# Patient Record
Sex: Female | Born: 1975 | Race: Asian | Hispanic: No | Smoking: Never smoker
Health system: Southern US, Community
[De-identification: ages and names within clinical notes are randomized; demographics above are authoritative.]

## PROBLEM LIST (undated history)

## (undated) DIAGNOSIS — K5900 Constipation, unspecified: Secondary | ICD-10-CM

## (undated) DIAGNOSIS — E785 Hyperlipidemia, unspecified: Secondary | ICD-10-CM

## (undated) HISTORY — DX: Hyperlipidemia, unspecified: E78.5

## (undated) HISTORY — DX: Constipation, unspecified: K59.00

---

## 2020-04-20 ENCOUNTER — Ambulatory Visit: Payer: Self-pay | Admitting: Medical

## 2020-04-22 ENCOUNTER — Encounter: Payer: Self-pay | Admitting: Medical

## 2020-04-22 ENCOUNTER — Telehealth: Payer: Self-pay | Admitting: Medical

## 2020-04-22 ENCOUNTER — Other Ambulatory Visit: Payer: Self-pay

## 2020-04-22 ENCOUNTER — Ambulatory Visit (INDEPENDENT_AMBULATORY_CARE_PROVIDER_SITE_OTHER): Payer: PRIVATE HEALTH INSURANCE | Admitting: Medical

## 2020-04-22 VITALS — BP 128/80 | HR 70 | Resp 20 | Ht 64.0 in | Wt 112.2 lb

## 2020-04-22 DIAGNOSIS — M7711 Lateral epicondylitis, right elbow: Secondary | ICD-10-CM | POA: Diagnosis not present

## 2020-04-22 DIAGNOSIS — Z1211 Encounter for screening for malignant neoplasm of colon: Secondary | ICD-10-CM | POA: Diagnosis not present

## 2020-04-22 DIAGNOSIS — E785 Hyperlipidemia, unspecified: Secondary | ICD-10-CM

## 2020-04-22 DIAGNOSIS — S46811A Strain of other muscles, fascia and tendons at shoulder and upper arm level, right arm, initial encounter: Secondary | ICD-10-CM | POA: Diagnosis not present

## 2020-04-22 LAB — LIPID PANEL
Cholesterol: 215 mg/dL — ABNORMAL HIGH (ref 0–200)
HDL: 66.3 mg/dL (ref >39.00)
LDL Cholesterol: 128 mg/dL — ABNORMAL HIGH (ref 0–99)
NonHDL: 148.71
Total CHOL/HDL Ratio: 3
Triglycerides: 104 mg/dL (ref 0.0–149.0)
VLDL: 20.8 mg/dL (ref 0.0–40.0)

## 2020-04-22 MED ORDER — CYCLOBENZAPRINE HCL 5 MG PO TABS
5.0000 mg | ORAL_TABLET | Freq: Every day | ORAL | 0 refills | Status: DC
Start: 2020-04-22 — End: 2020-07-27

## 2020-04-22 NOTE — Patient Instructions (Addendum)
For high cholesterol will get lipid panel today. Recommend diet and exercise. Can use fish oil or krill oil otc.  For elbow pain will go ahead and refer to sports med since present for so long.  For rt trapezius area pain will give flexeril to use at night. Suspect tension vs trapezius strain.  Refer to gi md for colonoscopy.  Follow up date to be determined after labs. Potentially in 3-6 months.

## 2020-04-22 NOTE — Telephone Encounter (Signed)
Future lipid panel placed with cmp.

## 2020-04-22 NOTE — Progress Notes (Addendum)
Subjective:    Patient ID: Meghan Henderson, female    DOB: 02/16/1976, 45 y.o.   MRN: [id number]  HPI  Pt in for first time.  Pt originally from [place]. Pt works for [employer]. Pt is getting her Masters. Not exercising due to being very busy. Pt eats moderate healthy. When does exercise likes to jog. Non smoker. Drinks alcohol social twice a month on weekends.  Pt never had a colonoscopy. Placed referral today.  Pt not on any meds other than vitamins.   Pt had mammogram 12-04-2019. Overall negative. No further studies recommend except repeat in one year. I don't see report in epic.  History of high cholesterol in the past about 5 years. Pt mother high cholesterol. Some cardiovascular hx in aunts and uncles.  The 10-year ASCVD risk score Denman George DC Montez Hageman., et al., 2013) is: 0.7%   Values used to calculate the score:     Age: 53 years     Sex: Female     Is Non-Hispanic African American: No     Diabetic: No     Tobacco smoker: No     Systolic Blood Pressure: 128 mmHg     Is BP treated: No     HDL Cholesterol: 66.3 mg/dL     Total Cholesterol: 215 mg/dL  [facility]. Pt thinks diet has been worse.    Review of Systems  Constitutional: Negative for chills and fatigue.  HENT: Negative for congestion and drooling.   Respiratory: Negative for cough, chest tightness, shortness of breath and wheezing.   Cardiovascular: Negative for chest pain and palpitations.  Gastrointestinal: Negative for abdominal pain.  Genitourinary: Negative for dyspareunia, dysuria and flank pain.  Musculoskeletal: Negative for back pain and joint swelling.       Rt elbow pain/lateral epicondyle. This has been case since June. Worse after working/using mouse.    Pt also states some rt occipital/trapezius junction area pain that she notes in the morning. Notices it for 2-3 months. No new pillow.  Pt states notices about 3-4 time a week. When gets up and starts day does not notice pain.   Skin: Negative for rash.  Neurological: Negative for dizziness, seizures, syncope, weakness, numbness and headaches.  Hematological: Negative for adenopathy. Does not bruise/bleed easily.  Psychiatric/Behavioral: Negative for behavioral problems, decreased concentration and dysphoric mood. The patient is not nervous/anxious.      No past medical history on file.   Social History   Socioeconomic History  . Marital status: Married    Spouse name: Not on file  . Number of children: Not on file  . Years of education: Not on file  . Highest education level: Not on file  Occupational History  . Not on file  Tobacco Use  . Smoking status: Not on file  . Smokeless tobacco: Not on file  Substance and Sexual Activity  . Alcohol use: Not on file  . Drug use: Not on file  . Sexual activity: Not on file  Other Topics Concern  . Not on file  Social History Narrative  . Not on file   Social Determinants of Health   Financial Resource Strain: Not on file  Food Insecurity: Not on file  Transportation Needs: Not on file  Physical Activity: Not on file  Stress: Not on file  Social Connections: Not on file  Intimate Partner Violence: Not on file     No family history on file.  Allergies  Allergen Reactions  .  Sulfamethoxazole Rash    No current outpatient medications on file prior to visit.   No current facility-administered medications on file prior to visit.    Pulse 70   Resp 20   Ht 5\' 4"  (1.626 m)   Wt 112 lb 3.2 oz (50.9 kg)   LMP 04/12/2020   SpO2 100%   BMI 19.26 kg/m       Objective:   Physical Exam  General Mental Status- Alert. General Appearance- Not in acute distress.   Skin General: Color- Normal Color. Moisture- Normal Moisture.  Neck Carotid Arteries- Normal color. Moisture- Normal Moisture. No carotid bruits. No JVD.  Chest and Lung Exam Auscultation: Breath Sounds:-Normal.  Cardiovascular Auscultation:Rythm- Regular. Murmurs & Other  Heart Sounds:Auscultation of the heart reveals- No Murmurs.  Abdomen Inspection:-Inspeection Normal. Palpation/Percussion:Note:No mass. Palpation and Percussion of the abdomen reveal- Non Tender, Non Distended + BS, no rebound or guarding.   Neurologic Cranial Nerve exam:- CN III-XII intact(No nystagmus), symmetric smile. Strength:- 5/5 equal and symmetric strength both upper and lower extremities.      Assessment & Plan:  For high cholesterol will get lipid panel today. Recommend diet and exercise. Can use fish oil or krill oil otc.  For elbow pain will go ahead and refer to sports med since present for so long.  For rt trapezius area pain will give flexeril to use at night. Suspect tension vs trapezius strain.  Refer to gi md for colonoscopy.  Follow up date to be determined after labs. Potentially in 3-6 months.    04/14/2020, PA-C   Time spent with patient today was 45  minutes which consisted of chart review, discussing diagnosis, work up treatment and documentation.

## 2020-04-28 ENCOUNTER — Encounter: Payer: Self-pay | Admitting: Gastroenterology

## 2020-05-06 ENCOUNTER — Other Ambulatory Visit: Payer: Self-pay

## 2020-05-06 ENCOUNTER — Ambulatory Visit: Payer: Self-pay

## 2020-05-06 ENCOUNTER — Ambulatory Visit: Payer: PRIVATE HEALTH INSURANCE | Admitting: Family Medicine

## 2020-05-06 VITALS — BP 100/60 | Ht 64.0 in | Wt 112.0 lb

## 2020-05-06 DIAGNOSIS — M7711 Lateral epicondylitis, right elbow: Secondary | ICD-10-CM

## 2020-05-06 DIAGNOSIS — M25521 Pain in right elbow: Secondary | ICD-10-CM

## 2020-05-06 HISTORY — DX: Lateral epicondylitis, right elbow: M77.11

## 2020-05-06 MED ORDER — NITROGLYCERIN 0.2 MG/HR TD PT24: MEDICATED_PATCH | TRANSDERMAL | 11 refills | Status: DC

## 2020-05-06 NOTE — Patient Instructions (Signed)
Nice to meet you  Please try the exercises  Please try the rub on medicine  Please try ice as needed Please send me a message in MyChart with any questions or updates.  Please see me back in 4 weeks. .   --Dr. Jordan Likes   Nitroglycerin Protocol   Apply 1/4 nitroglycerin patch to affected area daily.  Change position of patch within the affected area every 24 hours.  You may experience a headache during the first 1-2 weeks of using the patch, these should subside.  If you experience headaches after beginning nitroglycerin patch treatment, you may take your preferred over the counter pain reliever.  Another side effect of the nitroglycerin patch is skin irritation or rash related to patch adhesive.  Please notify our office if you develop more severe headaches or rash, and stop the patch.  Tendon healing with nitroglycerin patch may require 12 to 24 weeks depending on the extent of injury.  Men should not use if taking Viagra, Cialis, or Levitra.   Do not use if you have migraines or rosacea.

## 2020-05-06 NOTE — Progress Notes (Signed)
  Meghan Henderson - 45 y.o. female MRN [id number]  Date of birth: 07/27/1975  SUBJECTIVE:  Including CC & ROS.  No chief complaint on file.   Meghan Henderson is a 45 y.o. female that is presenting with acute on chronic right elbow pain.  Pain is been ongoing for several months.  Has not tried any therapies.  No history of surgery.  Thought she started having pain after doing a [employer] race.  Has changed her mouse to more of an ergonomic set up.  That has improved some of her pain.   Review of Systems See HPI   HISTORY: Past Medical, Surgical, Social, and Family History Reviewed & Updated per EMR.   Pertinent Historical Findings include:  Past Medical History:  Diagnosis Date  . Hyperlipidemia     Past Surgical History:  Procedure Laterality Date  . CESAREAN SECTION      Family History  Problem Relation Age of Onset  . Hypertension Mother   . Hyperlipidemia Mother   . Diabetes Father     Social History   Socioeconomic History  . Marital status: Married    Spouse name: Not on file  . Number of children: Not on file  . Years of education: Not on file  . Highest education level: Not on file  Occupational History  . Occupation: Systems developer.  Tobacco Use  . Smoking status: Never Smoker  . Smokeless tobacco: Never Used  Vaping Use  . Vaping Use: Never used  Substance and Sexual Activity  . Alcohol use: Yes    Alcohol/week: 1.0 standard drink    Types: 1 Glasses of wine per week  . Drug use: Never  . Sexual activity: Yes  Other Topics Concern  . Not on file  Social History Narrative  . Not on file   Social Determinants of Health   Financial Resource Strain: Not on file  Food Insecurity: Not on file  Transportation Needs: Not on file  Physical Activity: Not on file  Stress: Not on file  Social Connections: Not on file  Intimate Partner Violence: Not on file     PHYSICAL EXAM:  VS: BP 100/60   Ht 5\' 4"  (1.626 m)   Wt 112 lb (50.8 kg)   LMP 04/12/2020   BMI  19.22 kg/m  Physical Exam Gen: NAD, alert, cooperative with exam, well-appearing MSK:  Right elbow: Normal range of motion. Tenderness to palpation of the lateral condyle. No normal strength resistance. Neurovascular intact  Limited ultrasound: Right elbow:  No effusion in the elbow joint. Mucous changes at the origin of the common extensors. No hyperemia in this area. Normal-appearing triceps tendon  Summary: Findings consistent with tendinopathy  Ultrasound and interpretation by 04/14/2020, MD    ASSESSMENT & PLAN:   Lateral epicondylitis of right elbow Symptoms seem most consistent with tendinopathy at this area. -Counseled on home exercise therapy and supportive care. -Nitro patches. -Pennsaid samples. -Could consider injection or shockwave or physical therapy.

## 2020-05-06 NOTE — Progress Notes (Signed)
Medication Samples have been provided to the patient.  Drug name: pennsaid       Strength: 2%        Qty: 2 box  LOT: [id number]  Exp.Date: 5/22  Dosing instructions: use a pea size amount to the affected area  The patient has been instructed regarding the correct time, dose, and frequency of taking this medication, including desired effects and most common side effects.   Meghan Henderson 9:11 AM 05/06/2020

## 2020-05-06 NOTE — Assessment & Plan Note (Signed)
Symptoms seem most consistent with tendinopathy at this area. -Counseled on home exercise therapy and supportive care. -Nitro patches. -Pennsaid samples. -Could consider injection or shockwave or physical therapy.

## 2020-06-08 ENCOUNTER — Ambulatory Visit: Payer: PRIVATE HEALTH INSURANCE | Admitting: Family Medicine

## 2020-06-20 DIAGNOSIS — U071 COVID-19: Secondary | ICD-10-CM

## 2020-06-20 HISTORY — DX: COVID-19: U07.1

## 2020-06-29 ENCOUNTER — Ambulatory Visit (AMBULATORY_SURGERY_CENTER): Payer: Self-pay | Admitting: *Deleted

## 2020-06-29 VITALS — Ht 64.0 in | Wt 115.0 lb

## 2020-06-29 DIAGNOSIS — Z1211 Encounter for screening for malignant neoplasm of colon: Secondary | ICD-10-CM

## 2020-06-29 NOTE — Progress Notes (Addendum)

## 2020-07-07 ENCOUNTER — Telehealth: Payer: Self-pay | Admitting: Gastroenterology

## 2020-07-07 NOTE — Telephone Encounter (Signed)
Good morning Dr. Barron Alvine, patient called stating she tested positive for covid today, 07/07/20 so she rescheduled her procedure from 07/09/20 to 09/03/20.

## 2020-07-09 ENCOUNTER — Encounter: Payer: PRIVATE HEALTH INSURANCE | Admitting: Gastroenterology

## 2020-07-26 ENCOUNTER — Other Ambulatory Visit: Payer: PRIVATE HEALTH INSURANCE

## 2020-07-27 ENCOUNTER — Ambulatory Visit (INDEPENDENT_AMBULATORY_CARE_PROVIDER_SITE_OTHER): Payer: PRIVATE HEALTH INSURANCE | Admitting: Medical

## 2020-07-27 ENCOUNTER — Other Ambulatory Visit: Payer: Self-pay

## 2020-07-27 VITALS — BP 134/80 | HR 77 | Resp 18 | Ht 64.0 in | Wt 115.0 lb

## 2020-07-27 DIAGNOSIS — M542 Cervicalgia: Secondary | ICD-10-CM | POA: Diagnosis not present

## 2020-07-27 DIAGNOSIS — E785 Hyperlipidemia, unspecified: Secondary | ICD-10-CM | POA: Diagnosis not present

## 2020-07-27 DIAGNOSIS — R519 Headache, unspecified: Secondary | ICD-10-CM | POA: Diagnosis not present

## 2020-07-27 LAB — COMPREHENSIVE METABOLIC PANEL
ALT: 21 U/L (ref 0–35)
AST: 16 U/L (ref 0–37)
Albumin: 4.4 g/dL (ref 3.5–5.2)
Alkaline Phosphatase: 84 U/L (ref 39–117)
BUN: 8 mg/dL (ref 6–23)
CO2: 28 mEq/L (ref 19–32)
Calcium: 9 mg/dL (ref 8.4–10.5)
Chloride: 103 mEq/L (ref 96–112)
Creatinine, Ser: 0.65 mg/dL (ref 0.40–1.20)
GFR: 105.27 mL/min (ref >60.00)
Glucose, Bld: 86 mg/dL (ref 70–99)
Potassium: 4.5 mEq/L (ref 3.5–5.1)
Sodium: 139 mEq/L (ref 135–145)
Total Bilirubin: 0.8 mg/dL (ref 0.2–1.2)
Total Protein: 6.8 g/dL (ref 6.0–8.3)

## 2020-07-27 LAB — LIPID PANEL
Cholesterol: 250 mg/dL — ABNORMAL HIGH (ref 0–200)
HDL: 62 mg/dL (ref >39.00)
LDL Cholesterol: 170 mg/dL — ABNORMAL HIGH (ref 0–99)
NonHDL: 187.75
Total CHOL/HDL Ratio: 4
Triglycerides: 89 mg/dL (ref 0.0–149.0)
VLDL: 17.8 mg/dL (ref 0.0–40.0)

## 2020-07-27 MED ORDER — CYCLOBENZAPRINE HCL 5 MG PO TABS: 5.0000 mg | ORAL_TABLET | Freq: Every day | ORAL | 0 refills | Status: DC

## 2020-07-27 MED ORDER — DICLOFENAC SODIUM 75 MG PO TBEC: 75.0000 mg | DELAYED_RELEASE_TABLET | Freq: Two times a day (BID) | ORAL | 0 refills | Status: DC

## 2020-07-27 NOTE — Progress Notes (Signed)
Subjective:    Patient ID: Meghan Henderson, female    DOB: November 17, 1975, 45 y.o.   MRN: [id number]  HPI Pt in for follow up.  Pt got covid around 07-05-2020. She got cough, fever, ha, chills and fatigue. Never had wheezing or dspsnea. Pt has mild lingering occasional cough.   Pt states all of her symptoms have resolved after a week. But since recovered has been having intermittent HA on and off. States HA pattern about every 2 days then will HA will resolve.   Last ha was on Sunday. Took ibuprofen and tylenol. Pt does have history of headaches around her menstrual. No history of migraines. HA area occurring late in the day/afternoon.  Pt has hx of 2 covid vaccines and boosted.    Hx of high cholesterol.She is fasting and will get labs.  LMP- 3 weeks ago.    Review of Systems  Constitutional: Negative for chills and fatigue.  Respiratory: Negative for chest tightness, shortness of breath and wheezing.   Gastrointestinal: Negative for abdominal pain.  Musculoskeletal: Positive for neck pain. Negative for back pain.       Some throbbing neck pain even before she got covid.   Skin: Negative for rash.  Neurological: Positive for headaches. Negative for dizziness, speech difficulty, weakness and numbness.  Hematological: Negative for adenopathy. Does not bruise/bleed easily.  Psychiatric/Behavioral: Negative for behavioral problems, decreased concentration and sleep disturbance. The patient is not nervous/anxious.     Past Medical History:  Diagnosis Date  . Constipation   . Hyperlipidemia      Social History   Socioeconomic History  . Marital status: Married    Spouse name: Not on file  . Number of children: Not on file  . Years of education: Not on file  . Highest education level: Not on file  Occupational History  . Occupation: Systems developer.  Tobacco Use  . Smoking status: Never Smoker  . Smokeless tobacco: Never Used  Vaping Use  . Vaping Use: Never used  Substance and  Sexual Activity  . Alcohol use: Yes    Alcohol/week: 1.0 standard drink    Types: 1 Glasses of wine per week  . Drug use: Never  . Sexual activity: Yes  Other Topics Concern  . Not on file  Social History Narrative  . Not on file   Social Determinants of Health   Financial Resource Strain: Not on file  Food Insecurity: Not on file  Transportation Needs: Not on file  Physical Activity: Not on file  Stress: Not on file  Social Connections: Not on file  Intimate Partner Violence: Not on file    Past Surgical History:  Procedure Laterality Date  . CESAREAN SECTION      Family History  Problem Relation Age of Onset  . Hypertension Mother   . Hyperlipidemia Mother   . Diabetes Father   . Colon polyps Neg Hx   . Colon cancer Neg Hx   . Esophageal cancer Neg Hx   . Stomach cancer Neg Hx   . Rectal cancer Neg Hx     Allergies  Allergen Reactions  . Sulfamethoxazole Rash    Current Outpatient Medications on File Prior to Visit  Medication Sig Dispense Refill  . ascorbic acid (VITAMIN C) 100 MG tablet Take 1 tablet by mouth daily.    Marland Kitchen b complex vitamins capsule Take 1 capsule by mouth daily.    . Omega-3 1000 MG CAPS Take 1 capsule by mouth daily.    Marland Kitchen  VITAMIN D PO Take 1 tablet by mouth daily.     No current facility-administered medications on file prior to visit.    BP 134/80   Pulse 77   Resp 18   Ht 5\' 4"  (1.626 m)   Wt 115 lb (52.2 kg)   LMP 07/10/2020   SpO2 97%   BMI 19.74 kg/m       Objective:   Physical Exam  General Mental Status- Alert. General Appearance- Not in acute distress.   Skin General: Color- Normal Color. Moisture- Normal Moisture.  Neck Trapezius tenderness to palpation more on rt side.  Chest and Lung Exam Auscultation: Breath Sounds:-Normal.  Cardiovascular Auscultation:Rythm- Regular. Murmurs & Other Heart Sounds:Auscultation of the heart reveals- No Murmurs.  Abdomen Inspection:-Inspeection  Normal. Palpation/Percussion:Note:No mass. Palpation and Percussion of the abdomen reveal- Non Tender, Non Distended + BS, no rebound or guarding.   Neurologic Cranial Nerve exam:- CN III-XII intact(No nystagmus), symmetric smile. eom intact Strength:- 5/5 equal and symmetric strength both upper and lower extremities.      Assessment & Plan:  Post covid headache and some possible tension headache features combined. RX diclofenac nsaids and flexeril low dose to use at night. Rx advisement given.  If ha worsen, persist or change notify 07/12/2020.  If ha with any motor or sensory deficits then ED evaluation.  Rx cmp and lipid panel.  Follow up in 10-14 days or as needed.

## 2020-07-27 NOTE — Patient Instructions (Addendum)
Post covid headache and some possible tension headache features combined. Rx diclofenac nsaids and flexeril low dose to use at night. Rx advisement given.  If ha worsen, persist or change notify us.   If ha with any motor or sensory deficits then ED evaluation.  Rx cmp and lipid panel.  Follow up in 10-14 days or as needed.

## 2020-09-03 ENCOUNTER — Ambulatory Visit (AMBULATORY_SURGERY_CENTER): Payer: PRIVATE HEALTH INSURANCE | Admitting: Gastroenterology

## 2020-09-03 ENCOUNTER — Other Ambulatory Visit: Payer: Self-pay

## 2020-09-03 ENCOUNTER — Encounter: Payer: Self-pay | Admitting: Gastroenterology

## 2020-09-03 VITALS — BP 116/72 | HR 68 | Temp 97.8°F | Resp 17 | Ht 64.0 in | Wt 115.0 lb

## 2020-09-03 DIAGNOSIS — Z1211 Encounter for screening for malignant neoplasm of colon: Secondary | ICD-10-CM | POA: Diagnosis present

## 2020-09-03 HISTORY — PX: COLONOSCOPY: SHX174

## 2020-09-03 MED ORDER — SODIUM CHLORIDE 0.9 % IV SOLN: 500.0000 mL | Freq: Once | INTRAVENOUS | Status: DC

## 2020-09-03 NOTE — Op Note (Signed)
[facility] Patient Name: Meghan Henderson Procedure Date: 09/03/2020 11:35 AM MRN: [id number] Endoscopist: Doristine Locks , MD Age: 45 Referring MD:  Date of Birth: November 21, 1975 Gender: Female Account #: [id number] Procedure:                Colonoscopy Indications:              Screening for colorectal malignant neoplasm, This                            is the patient's first colonoscopy Medicines:                Monitored Anesthesia Care Procedure:                Pre-Anesthesia Assessment:                           - Prior to the procedure, a History and Physical                            was performed, and patient medications and                            allergies were reviewed. The patient's tolerance of                            previous anesthesia was also reviewed. The risks                            and benefits of the procedure and the sedation                            options and risks were discussed with the patient.                            All questions were answered, and informed consent                            was obtained. Prior Anticoagulants: The patient has                            taken no previous anticoagulant or antiplatelet                            agents. ASA Grade Assessment: II - A patient with                            mild systemic disease. After reviewing the risks                            and benefits, the patient was deemed in                            satisfactory condition to undergo the procedure.  After obtaining informed consent, the colonoscope                            was passed under direct vision. Throughout the                            procedure, the patient's blood pressure, pulse, and                            oxygen saturations were monitored continuously. The                            Olympus PCF-H190DL (#[id number]) Colonoscope was                            introduced through the anus  and advanced to the the                            terminal ileum. The colonoscopy was performed                            without difficulty. The patient tolerated the                            procedure well. The quality of the bowel                            preparation was good. The terminal ileum, ileocecal                            valve, appendiceal orifice, and rectum were                            photographed. Scope In: 11:47:24 AM Scope Out: 12:01:37 PM Scope Withdrawal Time: 0 hours 10 minutes 34 seconds  Total Procedure Duration: 0 hours 14 minutes 13 seconds  Findings:                 The perianal and digital rectal examinations were                            normal.                           The colon appeared normal throughout.                           The retroflexed view of the distal rectum and anal                            verge was normal and showed no anal or rectal                            abnormalities.  The terminal ileum appeared normal. Complications:            No immediate complications. Estimated Blood Loss:     Estimated blood loss: none. Impression:               - The entire examined colon is normal.                           - The distal rectum and anal verge are normal on                            retroflexion view.                           - The examined portion of the ileum was normal.                           - No specimens collected. Recommendation:           - Patient has a contact number available for                            emergencies. The signs and symptoms of potential                            delayed complications were discussed with the                            patient. Return to normal activities tomorrow.                            Written discharge instructions were provided to the                            patient.                           - Resume previous diet.                            - Continue present medications.                           - Repeat colonoscopy in 10 years for screening                            purposes.                           - Return to GI office PRN. Doristine Locks, MD 09/03/2020 12:04:46 PM

## 2020-09-03 NOTE — Progress Notes (Signed)
Report given to PACU, vss 

## 2020-09-03 NOTE — Progress Notes (Signed)
Pt's states no medical or surgical changes since previsit except dx with covid in May of this year and had to r/s this procedure.  [facility] [place]

## 2020-09-03 NOTE — Patient Instructions (Signed)
YOU HAD AN ENDOSCOPIC PROCEDURE TODAY AT THE [facility]:   Refer to the procedure report that was given to you for any specific questions about what was found during the examination.  If the procedure report does not answer your questions, please call your gastroenterologist to clarify.  If you requested that your care partner not be given the details of your procedure findings, then the procedure report has been included in a sealed envelope for you to review at your convenience later.  YOU SHOULD EXPECT: Some feelings of bloating in the abdomen. Passage of more gas than usual.  Walking can help get rid of the air that was put into your GI tract during the procedure and reduce the bloating. If you had a lower endoscopy (such as a colonoscopy or flexible sigmoidoscopy) you may notice spotting of blood in your stool or on the toilet paper. If you underwent a bowel prep for your procedure, you may not have a normal bowel movement for a few days.  Please Note:  You might notice some irritation and congestion in your nose or some drainage.  This is from the oxygen used during your procedure.  There is no need for concern and it should clear up in a day or so.  SYMPTOMS TO REPORT IMMEDIATELY:  Following lower endoscopy (colonoscopy or flexible sigmoidoscopy):  Excessive amounts of blood in the stool  Significant tenderness or worsening of abdominal pains  Swelling of the abdomen that is new, acute  Fever of 100F or higher   For urgent or emergent issues, a gastroenterologist can be reached at any hour by calling [phone]. Do not use MyChart messaging for urgent concerns.    DIET:  We do recommend a small meal at first, but then you may proceed to your regular diet.  Drink plenty of fluids but you should avoid alcoholic beverages for 24 hours.  ACTIVITY:  You should plan to take it easy for the rest of today and you should NOT DRIVE or use heavy machinery until tomorrow (because  of the sedation medicines used during the test).    FOLLOW UP: Our staff will call the number listed on your records 48-72 hours following your procedure to check on you and address any questions or concerns that you may have regarding the information given to you following your procedure. If we do not reach you, we will leave a message.  We will attempt to reach you two times.  During this call, we will ask if you have developed any symptoms of COVID 19. If you develop any symptoms (ie: fever, flu-like symptoms, shortness of breath, cough etc.) before then, please call [phone].  If you test positive for Covid 19 in the 2 weeks post procedure, please call and report this information to [place].    If any biopsies were taken you will be contacted by phone or by letter within the next 1-3 weeks.  Please call us at [phone] if you have not heard about the biopsies in 3 weeks.    SIGNATURES/CONFIDENTIALITY: You and/or your care partner have signed paperwork which will be entered into your electronic medical record.  These signatures attest to the fact that that the information above on your After Visit Summary has been reviewed and is understood.  Full responsibility of the confidentiality of this discharge information lies with you and/or your care-partner.    Resume medications. Repeat procedure in 10 years.

## 2020-09-07 ENCOUNTER — Telehealth: Payer: Self-pay | Admitting: *Deleted

## 2020-09-07 ENCOUNTER — Telehealth: Payer: Self-pay

## 2020-09-07 NOTE — Telephone Encounter (Signed)
  Follow up Call-  Call back number 09/03/2020  Post procedure Call Back phone  # [phone]  Permission to leave phone message Yes     Patient questions:  Do you have a fever, pain , or abdominal swelling? No. Pain Score  0 *  Have you tolerated food without any problems? Yes.    Have you been able to return to your normal activities? Yes.    Do you have any questions about your discharge instructions: Diet   No. Medications  No. Follow up visit  No.  Do you have questions or concerns about your Care? No.  Actions: * If pain score is 4 or above: No action needed, pain <4.  Have you developed a fever since your procedure? no  2.   Have you had an respiratory symptoms (SOB or cough) since your procedure? no  3.   Have you tested positive for COVID 19 since your procedure no  4.   Have you had any family members/close contacts diagnosed with the COVID 19 since your procedure?  no   If yes to any of these questions please route to Laverna Peace, RN and Karlton Lemon, RN

## 2020-09-07 NOTE — Telephone Encounter (Signed)
Attempted to reach pt. For post-procedure f/u call. No answer. Left message that we will make another attempt to reach her later today and for her to please not hesitate to call us if she has any questions/concerns regarding her care. 

## 2020-12-30 ENCOUNTER — Encounter: Payer: Self-pay | Admitting: Medical

## 2021-03-02 ENCOUNTER — Telehealth: Payer: Self-pay | Admitting: *Deleted

## 2021-03-02 NOTE — Telephone Encounter (Signed)
Patient given all office information for scheduled appointment on 03/10/2021 at 9:10 with an 8:55 AM arrival time.

## 2021-03-10 ENCOUNTER — Ambulatory Visit (INDEPENDENT_AMBULATORY_CARE_PROVIDER_SITE_OTHER): Payer: PRIVATE HEALTH INSURANCE | Admitting: Obstetrics & Gynecology

## 2021-03-10 ENCOUNTER — Encounter: Payer: Self-pay | Admitting: Obstetrics & Gynecology

## 2021-03-10 ENCOUNTER — Other Ambulatory Visit: Payer: Self-pay

## 2021-03-10 VITALS — BP 119/81 | HR 87 | Ht 64.0 in | Wt 115.0 lb

## 2021-03-10 DIAGNOSIS — A6004 Herpesviral vulvovaginitis: Secondary | ICD-10-CM | POA: Diagnosis not present

## 2021-03-10 DIAGNOSIS — A6000 Herpesviral infection of urogenital system, unspecified: Secondary | ICD-10-CM | POA: Insufficient documentation

## 2021-03-10 DIAGNOSIS — Z01419 Encounter for gynecological examination (general) (routine) without abnormal findings: Secondary | ICD-10-CM | POA: Diagnosis not present

## 2021-03-10 MED ORDER — VALACYCLOVIR HCL 1 G PO TABS: 1000.0000 mg | ORAL_TABLET | Freq: Every day | ORAL | 3 refills | Status: DC

## 2021-03-10 NOTE — Progress Notes (Signed)
GYNECOLOGY ANNUAL PREVENTATIVE CARE ENCOUNTER NOTE  History:     Meghan Henderson is a 46 y.o. [phone] female here for a routine annual gynecologic exam.  Current complaints: she has history of genital HSV, feels like she is starting to have an outbreak with feeling like something is open on the bottom part of her vaginal opening and hurts.   Denies abnormal vaginal bleeding, discharge, other pelvic pain, problems with intercourse or other gynecologic concerns.    Gynecologic History Patient's last menstrual period was 03/06/2021. Contraception: Paragard IUD, placed in 2017 Last Pap: 01/16/2020. Result was normal with negative HPV Last Mammogram: 12/04/2019.  Result was normal Last Colonoscopy: 09/03/2020.  Result was normal  Obstetric History OB History  Gravida Para Term Preterm AB Living  5 3 3   2 3   SAB IAB Ectopic Multiple Live Births  1 1          # Outcome Date GA Lbr Len/2nd Weight Sex Delivery Anes PTL Lv  5 IAB           4 SAB           3 Term           2 Term           1 Term           One vaginal delivery, two cesarean sections  Past Medical History:  Diagnosis Date   Constipation    COVID 06/2020   Hyperlipidemia    Lateral epicondylitis of right elbow 05/06/2020    Past Surgical History:  Procedure Laterality Date   CESAREAN SECTION     COLONOSCOPY  09/03/2020    Current Outpatient Medications on File Prior to Visit  Medication Sig Dispense Refill   ascorbic acid (VITAMIN C) 100 MG tablet Take 1 tablet by mouth daily.     b complex vitamins capsule Take 1 capsule by mouth daily.     cyclobenzaprine (FLEXERIL) 5 MG tablet Take 1 tablet (5 mg total) by mouth at bedtime. 7 tablet 0   diclofenac (VOLTAREN) 75 MG EC tablet Take 1 tablet (75 mg total) by mouth 2 (two) times daily. 14 tablet 0   ibuprofen (ADVIL) 200 MG tablet Take 200 mg by mouth every 6 (six) hours as needed.     Omega-3 1000 MG CAPS Take 1 capsule by mouth daily.     PARAGARD INTRAUTERINE  COPPER IU by Intrauterine route.     VITAMIN D PO Take 1 tablet by mouth daily.     Current Facility-Administered Medications on File Prior to Visit  Medication Dose Route Frequency Provider Last Rate Last Admin   0.9 %  sodium chloride infusion  500 mL Intravenous Once Cirigliano, Vito V, DO        Allergies  Allergen Reactions   Sulfamethoxazole Rash    Social History:  reports that she has never smoked. She has never used smokeless tobacco. She reports current alcohol use of about 1.0 standard drink per week. She reports that she does not use drugs.  Family History  Problem Relation Age of Onset   Hypertension Mother    Hyperlipidemia Mother    Diabetes Father    Colon polyps Neg Hx    Colon cancer Neg Hx    Esophageal cancer Neg Hx    Stomach cancer Neg Hx    Rectal cancer Neg Hx     The following portions of the patient's history were reviewed and updated as appropriate:  allergies, current medications, past family history, past medical history, past social history, past surgical history and problem list.  Review of Systems Pertinent items noted in HPI and remainder of comprehensive ROS otherwise negative.  Physical Exam:  BP 119/81    Pulse 87    Ht 5\' 4"  (1.626 m)    Wt 115 lb (52.2 kg)    LMP 03/06/2021    BMI 19.74 kg/m  CONSTITUTIONAL: Well-developed, well-nourished female in no acute distress.  HENT:  Normocephalic, atraumatic, External right and left ear normal.  EYES: Conjunctivae and EOM are normal. Pupils are equal, round, and reactive to light. No scleral icterus.  NECK: Normal range of motion, supple, no masses.  Normal thyroid.  SKIN: Skin is warm and dry. No rash noted. Not diaphoretic. No erythema. No pallor. MUSCULOSKELETAL: Normal range of motion. No tenderness.  No cyanosis, clubbing, or edema. NEUROLOGIC: Alert and oriented to person, place, and time. Normal reflexes, muscle tone coordination.  PSYCHIATRIC: Normal mood and affect. Normal behavior.  Normal judgment and thought content. CARDIOVASCULAR: Normal heart rate noted, regular rhythm RESPIRATORY: Clear to auscultation bilaterally. Effort and breath sounds normal, no problems with respiration noted. BREASTS: Symmetric in size. No masses, tenderness, skin changes, nipple drainage, or lymphadenopathy bilaterally. Performed in the presence of a chaperone. ABDOMEN: Soft, no distention noted.  No tenderness, rebound or guarding.  PELVIC: Small erythematous blisters noted in posterior fourchette, no other lesion, otherwise normal appearing external genitalia and urethral meatus; normal appearing distal vaginal mucosa. Speculum exam deferred.   Performed in the presence of a chaperone.   Assessment and Plan:    1. Herpes simplex vulvovaginitis Valtrex prescribed, she was told to contact 03/08/2021 for worsening or not adequately treated symptoms.  - valACYclovir (VALTREX) 1000 MG tablet; Take 1 tablet (1,000 mg total) by mouth daily. Take for 5 days  Dispense: 5 tablet; Refill: 3  2. Well woman exam with routine gynecological exam Up to date on pap smear, mammogram and colonoscopy. Routine preventative health maintenance measures emphasized. Please refer to After Visit Summary for other counseling recommendations.      [place], MD, FACOG Obstetrician & Gynecologist, [facility] for [facility]

## 2021-06-08 ENCOUNTER — Telehealth: Payer: Self-pay | Admitting: *Deleted

## 2021-06-08 NOTE — Telephone Encounter (Signed)
Returned call from 11:47 AM, phone forwards at 11:30 AM for lunch. Left patient a message to call office to schedule appointment. ?

## 2021-06-17 ENCOUNTER — Ambulatory Visit (INDEPENDENT_AMBULATORY_CARE_PROVIDER_SITE_OTHER): Payer: PRIVATE HEALTH INSURANCE | Admitting: Medical

## 2021-06-17 ENCOUNTER — Ambulatory Visit: Payer: PRIVATE HEALTH INSURANCE | Admitting: Women's Health

## 2021-06-17 ENCOUNTER — Encounter: Payer: Self-pay | Admitting: Women's Health

## 2021-06-17 ENCOUNTER — Other Ambulatory Visit (HOSPITAL_COMMUNITY)
Admission: RE | Admit: 2021-06-17 | Discharge: 2021-06-17 | Disposition: A | Discharge status: Home / Self Care | Payer: PRIVATE HEALTH INSURANCE | Source: Ambulatory Visit | Attending: Women's Health | Admitting: Women's Health

## 2021-06-17 VITALS — BP 118/79 | HR 78 | Ht 64.0 in | Wt 115.0 lb

## 2021-06-17 VITALS — BP 116/80 | HR 72 | Ht 64.0 in | Wt 115.0 lb

## 2021-06-17 DIAGNOSIS — N920 Excessive and frequent menstruation with regular cycle: Secondary | ICD-10-CM | POA: Insufficient documentation

## 2021-06-17 DIAGNOSIS — Z0000 Encounter for general adult medical examination without abnormal findings: Secondary | ICD-10-CM | POA: Diagnosis not present

## 2021-06-17 LAB — CBC WITH DIFFERENTIAL/PLATELET
Basophils Absolute: 0 10*3/uL (ref 0.0–0.1)
Basophils Relative: 0.5 % (ref 0.0–3.0)
Eosinophils Absolute: 0.1 10*3/uL (ref 0.0–0.7)
Eosinophils Relative: 0.9 % (ref 0.0–5.0)
HCT: 44.9 % (ref 36.0–46.0)
Hemoglobin: 14.6 g/dL (ref 12.0–15.0)
Lymphocytes Relative: 30.6 % (ref 12.0–46.0)
Lymphs Abs: 1.9 10*3/uL (ref 0.7–4.0)
MCHC: 32.6 g/dL (ref 30.0–36.0)
MCV: 88.3 fl (ref 78.0–100.0)
Monocytes Absolute: 0.4 10*3/uL (ref 0.1–1.0)
Monocytes Relative: 5.7 % (ref 3.0–12.0)
Neutro Abs: 3.9 10*3/uL (ref 1.4–7.7)
Neutrophils Relative %: 62.3 % (ref 43.0–77.0)
Platelets: 216 10*3/uL (ref 150.0–400.0)
RBC: 5.09 Mil/uL (ref 3.87–5.11)
RDW: 13.5 % (ref 11.5–15.5)
WBC: 6.3 10*3/uL (ref 4.0–10.5)

## 2021-06-17 LAB — COMPREHENSIVE METABOLIC PANEL
ALT: 16 U/L (ref 0–35)
AST: 16 U/L (ref 0–37)
Albumin: 4.5 g/dL (ref 3.5–5.2)
Alkaline Phosphatase: 60 U/L (ref 39–117)
BUN: 10 mg/dL (ref 6–23)
CO2: 27 mEq/L (ref 19–32)
Calcium: 9 mg/dL (ref 8.4–10.5)
Chloride: 104 mEq/L (ref 96–112)
Creatinine, Ser: 0.66 mg/dL (ref 0.40–1.20)
GFR: 104.23 mL/min (ref >60.00)
Glucose, Bld: 90 mg/dL (ref 70–99)
Potassium: 5.1 mEq/L (ref 3.5–5.1)
Sodium: 137 mEq/L (ref 135–145)
Total Bilirubin: 0.4 mg/dL (ref 0.2–1.2)
Total Protein: 7 g/dL (ref 6.0–8.3)

## 2021-06-17 LAB — POCT URINE PREGNANCY: Preg Test, Ur: NEGATIVE

## 2021-06-17 LAB — LIPID PANEL
Cholesterol: 240 mg/dL — ABNORMAL HIGH (ref 0–200)
HDL: 71.6 mg/dL (ref >39.00)
LDL Cholesterol: 147 mg/dL — ABNORMAL HIGH (ref 0–99)
NonHDL: 168.17
Total CHOL/HDL Ratio: 3
Triglycerides: 107 mg/dL (ref 0.0–149.0)
VLDL: 21.4 mg/dL (ref 0.0–40.0)

## 2021-06-17 NOTE — Progress Notes (Signed)
? ?Subjective:  ? ? Patient ID: Tu Ference, female    DOB: 06-Mar-1975, 46 y.o.   MRN: [id number] ? ?HPI ? ?Pt in for wellness exam. She is fasting. ? 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 Pt has not been exercising. Not the best diet. No sodas. 1-2 cups coffee a day. ? ? ?Recent stress as mother in law sick. ? ?Up to date on mammogram, papsmear and colonoscopy. ? ? ?High cholesterol levels. ? ? ? ?Review of Systems  ?Constitutional:  Negative for chills, fatigue and fever.  ?HENT:  Negative for congestion, dental problem, ear pain, hearing loss, postnasal drip, rhinorrhea and sinus pain.   ?Respiratory:  Negative for chest tightness, shortness of breath and wheezing.   ?Cardiovascular:  Negative for chest pain and palpitations.  ?Gastrointestinal:  Negative for abdominal pain, blood in stool, nausea and vomiting.  ?Genitourinary:  Negative for dysuria and frequency.  ?Musculoskeletal:  Negative for back pain, joint swelling, myalgias and neck stiffness.  ?Skin:  Negative for rash.  ?Neurological:  Negative for dizziness and headaches.  ?Hematological:  Negative for adenopathy. Does not bruise/bleed easily.  ?Psychiatric/Behavioral:  Negative for behavioral problems and dysphoric mood.   ? ? ?Past Medical History:  ?Diagnosis Date  ? Constipation   ? COVID 06/2020  ? Hyperlipidemia   ? Lateral epicondylitis of right elbow 05/06/2020  ? ?  ?Social History  ? ?Socioeconomic History  ? Marital status: Married  ?  Spouse name: Not on file  ? Number of children: Not on file  ? Years of education: Not on file  ? Highest education level: Not on file  ?Occupational History  ? Occupation: Administrator.  ?Tobacco Use  ? Smoking status: Never  ? Smokeless tobacco: Never  ?Vaping Use  ? Vaping Use: Never used  ?Substance and Sexual Activity  ? Alcohol use: Yes  ?  Alcohol/week: 1.0 standard drink  ?  Types: 1 Glasses of wine per week  ? Drug use: Never  ? Sexual activity: Yes  ?  Birth control/protection: I.U.D.   ?Other Topics Concern  ? Not on file  ?Social History Narrative  ? Not on file  ? ?Social Determinants of Health  ? ?Financial Resource Strain: Not on file  ?Food Insecurity: Not on file  ?Transportation Needs: Not on file  ?Physical Activity: Not on file  ?Stress: Not on file  ?Social Connections: Not on file  ?Intimate Partner Violence: Not on file  ? ? ?Past Surgical History:  ?Procedure Laterality Date  ? CESAREAN SECTION    ? COLONOSCOPY  09/03/2020  ? ? ?Family History  ?Problem Relation Age of Onset  ? Hypertension Mother   ? Hyperlipidemia Mother   ? Diabetes Father   ? Colon polyps Neg Hx   ? Colon cancer Neg Hx   ? Esophageal cancer Neg Hx   ? Stomach cancer Neg Hx   ? Rectal cancer Neg Hx   ? ? ?Allergies  ?Allergen Reactions  ? Sulfamethoxazole Rash  ? ? ?Current Outpatient Medications on File Prior to Visit  ?Medication Sig Dispense Refill  ? ascorbic acid (VITAMIN C) 100 MG tablet Take 1 tablet by mouth daily.    ? b complex vitamins capsule Take 1 capsule by mouth daily.    ? ibuprofen (ADVIL) 200 MG tablet Take 200 mg by mouth every 6 (six) hours as needed.    ? Omega-3 1000 MG CAPS Take 1 capsule by mouth daily.    ? PARAGARD  INTRAUTERINE COPPER IU by Intrauterine route.    ? VITAMIN D PO Take 1 tablet by mouth daily.    ? ?Current Facility-Administered Medications on File Prior to Visit  ?Medication Dose Route Frequency Provider Last Rate Last Admin  ? 0.9 %  sodium chloride infusion  500 mL Intravenous Once Cirigliano, Vito V, DO      ? ? ?BP 116/80   Pulse 72   Ht 5\' 4"  (1.626 m)   Wt 115 lb (52.2 kg)   LMP 06/13/2021   SpO2 100%   BMI 19.74 kg/m?  ?  ?   ?Objective:  ? Physical Exam ? ?General ?Mental Status- Alert. General Appearance- Not in acute distress.  ? ?Skin ?General: Color- Normal Color. Moisture- Normal Moisture. ? ?Neck ?Carotid Arteries- Normal color. Moisture- Normal Moisture. No carotid bruits. No JVD. ? ?Chest and Lung Exam ?Auscultation: ?Breath  Sounds:-Normal. ? ?Cardiovascular ?Auscultation:Rythm- Regular. ?Murmurs & Other Heart Sounds:Auscultation of the heart reveals- No Murmurs. ? ?Abdomen ?Inspection:-Inspeection Normal. ?Palpation/Percussion:Note:No mass. Palpation and Percussion of the abdomen reveal- Non Tender, Non Distended + BS, no rebound or guarding. ? ?Neurologic ?Cranial Nerve exam:- CN III-XII intact(No nystagmus), symmetric smile. ?Strength:- 5/5 equal and symmetric strength both upper and lower extremities.  ? ? ?   ?Assessment & Plan:  ? ?For you wellness exam today I have ordered cbc, cmp and lipid panel. ? ?Vaccine up to date. ? ?Recommend exercise and healthy diet. ? ?We will let you know lab results as they come in. ? ?Follow up date appointment will be determined after lab review. ? ? ?Mackie Pai, PA-C    ?

## 2021-06-17 NOTE — Patient Instructions (Addendum)
For you wellness exam today I have ordered cbc, cmp and lipid panel. ? ?Vaccine up to date. ? ?Recommend exercise and healthy diet. ? ?We will let you know lab results as they come in. ? ?Follow up date appointment will be determined after lab review.   ? ? ?Preventive Care 16-48 Years Old, Female ?Preventive care refers to lifestyle choices and visits with your health care provider that can promote health and wellness. Preventive care visits are also called wellness exams. ?What can I expect for my preventive care visit? ?Counseling ?Your health care provider may ask you questions about your: ?Medical history, including: ?Past medical problems. ?Family medical history. ?Pregnancy history. ?Current health, including: ?Menstrual cycle. ?Method of birth control. ?Emotional well-being. ?Home life and relationship well-being. ?Sexual activity and sexual health. ?Lifestyle, including: ?Alcohol, nicotine or tobacco, and drug use. ?Access to firearms. ?Diet, exercise, and sleep habits. ?Work and work Statistician. ?Sunscreen use. ?Safety issues such as seatbelt and bike helmet use. ?Physical exam ?Your health care provider will check your: ?Height and weight. These may be used to calculate your BMI (body mass index). BMI is a measurement that tells if you are at a healthy weight. ?Waist circumference. This measures the distance around your waistline. This measurement also tells if you are at a healthy weight and may help predict your risk of certain diseases, such as type 2 diabetes and high blood pressure. ?Heart rate and blood pressure. ?Body temperature. ?Skin for abnormal spots. ?What immunizations do I need? ? ?Vaccines are usually given at various ages, according to a schedule. Your health care provider will recommend vaccines for you based on your age, medical history, and lifestyle or other factors, such as travel or where you work. ?What tests do I need? ?Screening ?Your health care provider may recommend screening  tests for certain conditions. This may include: ?Lipid and cholesterol levels. ?Diabetes screening. This is done by checking your blood sugar (glucose) after you have not eaten for a while (fasting). ?Pelvic exam and Pap test. ?Hepatitis B test. ?Hepatitis C test. ?HIV (human immunodeficiency virus) test. ?STI (sexually transmitted infection) testing, if you are at risk. ?Lung cancer screening. ?Colorectal cancer screening. ?Mammogram. Talk with your health care provider about when you should start having regular mammograms. This may depend on whether you have a family history of breast cancer. ?BRCA-related cancer screening. This may be done if you have a family history of breast, ovarian, tubal, or peritoneal cancers. ?Bone density scan. This is done to screen for osteoporosis. ?Talk with your health care provider about your test results, treatment options, and if necessary, the need for more tests. ?Follow these instructions at home: ?Eating and drinking ? ?Eat a diet that includes fresh fruits and vegetables, whole grains, lean protein, and low-fat dairy products. ?Take vitamin and mineral supplements as recommended by your health care provider. ?Do not drink alcohol if: ?Your health care provider tells you not to drink. ?You are pregnant, may be pregnant, or are planning to become pregnant. ?If you drink alcohol: ?Limit how much you have to 0-1 drink a day. ?Know how much alcohol is in your drink. In the [place] one drink equals one 12 oz bottle of beer (355 mL), one 5 oz glass of wine (148 mL), or one 1? oz glass of hard liquor (44 mL). ?Lifestyle ?Brush your teeth every morning and night with fluoride toothpaste. Floss one time each day. ?Exercise for at least 30 minutes 5 or more days each week. ?Do not  use any products that contain nicotine or tobacco. These products include cigarettes, chewing tobacco, and vaping devices, such as e-cigarettes. If you need help quitting, ask your health care provider. ?Do not  use drugs. ?If you are sexually active, practice safe sex. Use a condom or other form of protection to prevent STIs. ?If you do not wish to become pregnant, use a form of birth control. If you plan to become pregnant, see your health care provider for a prepregnancy visit. ?Take aspirin only as told by your health care provider. Make sure that you understand how much to take and what form to take. Work with your health care provider to find out whether it is safe and beneficial for you to take aspirin daily. ?Find healthy ways to manage stress, such as: ?Meditation, yoga, or listening to music. ?Journaling. ?Talking to a trusted person. ?Spending time with friends and family. ?Minimize exposure to UV radiation to reduce your risk of skin cancer. ?Safety ?Always wear your seat belt while driving or riding in a vehicle. ?Do not drive: ?If you have been drinking alcohol. Do not ride with someone who has been drinking. ?When you are tired or distracted. ?While texting. ?If you have been using any mind-altering substances or drugs. ?Wear a helmet and other protective equipment during sports activities. ?If you have firearms in your house, make sure you follow all gun safety procedures. ?Seek help if you have been physically or sexually abused. ?What's next? ?Visit your health care provider once a year for an annual wellness visit. ?Ask your health care provider how often you should have your eyes and teeth checked. ?Stay up to date on all vaccines. ?This information is not intended to replace advice given to you by your health care provider. Make sure you discuss any questions you have with your health care provider. ?Document Revised: 08/04/2020 Document Reviewed: 08/04/2020 ?Elsevier Patient Education ? [place]. ? ? ? ? ? ? ? ? ? ? ? ? ? ? ? ? ?

## 2021-06-17 NOTE — Progress Notes (Signed)
?  History:  ?[name] is a 46 y.o. [id number] who presents to clinic today for vaginal spotting. Patient reports this started around 06/08/2021 and lasted about 5 days, but the symptoms she was concerned about are no longer occurring today. Patient reports the spotting was bright red and she was seeing it in her underwear and when wiping. Patient reports needing to wear a panty liner, and was changing the liner about once a day. Patient also endorses a cloudy discharge that was white in color that accompanied the spotting, but has resolved now. Patient denies anything like this happening in the past. Patient states she does not have a particular concern of what this might be. Patient currently has an IUD in place, that was inserted about 5 years ago, and reports having the Paragard IUD.  Patient reports no intercourse in the past 8 months. UPT today in office negative. ? ?The following portions of the patient's history were reviewed and updated as appropriate: allergies, current medications, family history, past medical history, social history, past surgical history and problem list. ? ?Review of Systems:  ?Review of Systems  ?All other systems reviewed and are negative. ?  ?Objective:  ?Physical Exam ?BP 118/79   Pulse 78   Ht 5\' 4"  (1.626 m)   Wt 115 lb (52.2 kg)   LMP 06/13/2021   BMI 19.74 kg/m?  ? ?Physical Exam ?Vitals and nursing note reviewed. Exam conducted with a chaperone present.  ?Constitutional:   ?   General: She is not in acute distress. ?   Appearance: Normal appearance. She is not ill-appearing, toxic-appearing or diaphoretic.  ?HENT:  ?   Head: Normocephalic and atraumatic.  ?Pulmonary:  ?   Effort: Pulmonary effort is normal.  ?Genitourinary: ?   General: Normal vulva.  ?   Labia:     ?   Right: No rash, tenderness, lesion or injury.     ?   Left: No rash, tenderness, lesion or injury.   ?   Vagina: Normal.  ?   Cervix: Normal.  ?Skin: ?   General: Skin is warm and dry.  ?Neurological:   ?   Mental Status: She is alert and oriented to person, place, and time.  ?Psychiatric:     ?   Mood and Affect: Mood normal.     ?   Behavior: Behavior normal.     ?   Thought Content: Thought content normal.     ?   Judgment: Judgment normal.  ? ?Labs and Imaging ?No results found for this or any previous visit (from the past 24 hour(s)). ? ?No results found. ? ?Health Maintenance Due  ?Topic Date Due  ? HIV Screening  Never done  ? Hepatitis C Screening  Never done  ? COVID-19 Vaccine (4 - Booster for Pfizer series) 03/18/2020  ? ?Assessment & Plan:  ?1. Spotting ?- normal exam ?- POCT urine pregnancy ?- Cervicovaginal ancillary only( ) ?- US PELVIC COMPLETE WITH TRANSVAGINAL; Future ? ?Approximately 15 minutes of total time was spent with this patient on counseling, exam. ? ?Caprina Wussow, Gerrie Nordmann, NP ?06/17/2021 ?8:28 AM ? ?

## 2021-06-21 LAB — CERVICOVAGINAL ANCILLARY ONLY
Chlamydia: NEGATIVE
Comment: NEGATIVE
Comment: NEGATIVE
Comment: NORMAL
Neisseria Gonorrhea: NEGATIVE
Trichomonas: NEGATIVE

## 2021-06-24 ENCOUNTER — Ambulatory Visit (INDEPENDENT_AMBULATORY_CARE_PROVIDER_SITE_OTHER): Payer: PRIVATE HEALTH INSURANCE

## 2021-06-24 DIAGNOSIS — N939 Abnormal uterine and vaginal bleeding, unspecified: Secondary | ICD-10-CM | POA: Diagnosis not present

## 2021-06-24 DIAGNOSIS — N920 Excessive and frequent menstruation with regular cycle: Secondary | ICD-10-CM

## 2021-12-09 LAB — HM MAMMOGRAPHY

## 2022-02-22 ENCOUNTER — Encounter: Payer: Self-pay | Admitting: Medical

## 2022-02-27 ENCOUNTER — Telehealth: Payer: Self-pay | Admitting: Medical

## 2022-02-27 NOTE — Telephone Encounter (Signed)
Request transfer to another [place] PCP  Pt is requesting to transfer FROM:  [place]  Pt is requesting to transfer TO: [facility]  Reason for requested transfer:  Dr. No longer in network with [facility] pt states she checked on the [facility] and we are in [place] contact number:   [phone]

## 2022-02-27 NOTE — Telephone Encounter (Signed)
If that is the only reason for transfer, I would encourage them to call insurance company. That provider and I are both, [facility] ? If I am covered, he is too.

## 2022-04-21 ENCOUNTER — Encounter: Payer: Self-pay | Admitting: Family

## 2022-04-21 ENCOUNTER — Ambulatory Visit (INDEPENDENT_AMBULATORY_CARE_PROVIDER_SITE_OTHER): Payer: PRIVATE HEALTH INSURANCE | Admitting: Family

## 2022-04-21 ENCOUNTER — Ambulatory Visit (HOSPITAL_BASED_OUTPATIENT_CLINIC_OR_DEPARTMENT_OTHER)
Admission: RE | Admit: 2022-04-21 | Discharge: 2022-04-21 | Disposition: A | Discharge status: Home / Self Care | Payer: PRIVATE HEALTH INSURANCE | Source: Ambulatory Visit | Attending: Family | Admitting: Family

## 2022-04-21 ENCOUNTER — Ambulatory Visit: Payer: PRIVATE HEALTH INSURANCE | Admitting: Family

## 2022-04-21 ENCOUNTER — Encounter: Payer: Self-pay | Admitting: Medical

## 2022-04-21 VITALS — BP 118/72 | HR 64 | Resp 18 | Ht 64.0 in | Wt 115.0 lb

## 2022-04-21 DIAGNOSIS — M25522 Pain in left elbow: Secondary | ICD-10-CM | POA: Insufficient documentation

## 2022-04-21 NOTE — Progress Notes (Signed)
Meghan Henderson is a 47 y.o. female with the following history as recorded in EpicCare:  Patient Active Problem List   Diagnosis Date Noted   Genital herpes 03/10/2021   Lateral epicondylitis of right elbow 05/06/2020    Current Outpatient Medications  Medication Sig Dispense Refill   b complex vitamins capsule Take 1 capsule by mouth daily.     Omega-3 1000 MG CAPS Take 1 capsule by mouth daily.     VITAMIN D PO Take 1 tablet by mouth daily.     ascorbic acid (VITAMIN C) 100 MG tablet Take 1 tablet by mouth daily. (Patient not taking: Reported on 04/21/2022)     ibuprofen (ADVIL) 200 MG tablet Take 200 mg by mouth every 6 (six) hours as needed. (Patient not taking: Reported on 04/21/2022)     PARAGARD INTRAUTERINE COPPER IU by Intrauterine route. (Patient not taking: Reported on 04/21/2022)     No current facility-administered medications for this visit.    Allergies: Sulfamethoxazole  Past Medical History:  Diagnosis Date   Constipation    COVID 06/2020   Hyperlipidemia    Lateral epicondylitis of right elbow 05/06/2020    Past Surgical History:  Procedure Laterality Date   CESAREAN SECTION     COLONOSCOPY  09/03/2020    Family History  Problem Relation Age of Onset   Hypertension Mother    Hyperlipidemia Mother    Diabetes Father    Colon polyps Neg Hx    Colon cancer Neg Hx    Esophageal cancer Neg Hx    Stomach cancer Neg Hx    Rectal cancer Neg Hx     Social History   Tobacco Use   Smoking status: Never   Smokeless tobacco: Never  Substance Use Topics   Alcohol use: Yes    Alcohol/week: 1.0 standard drink of alcohol    Types: 1 Glasses of wine per week    Subjective:   Patient slipped and fell on her left elbow on 04/04/22; she is concerned that elbow may be fractured/ does not feel like it is healing appropriately; is concerned that may have chipped the bone- feels something "sharp" in the elbow when bending it;   Objective:  Vitals:   04/21/22 1521  BP:  118/72  Pulse: 64  Resp: 18  SpO2: 99%  Weight: 115 lb (52.2 kg)  Height: '5\' 4"'$  (1.626 m)    General: Well developed, well nourished, in no acute distress  Skin : Warm and dry.  Head: Normocephalic and atraumatic  Lungs: Respirations unlabored;  Musculoskeletal: No deformities; no active joint inflammation  Extremities: No edema, cyanosis, clubbing  Vessels: Symmetric bilaterally  Neurologic: Alert and oriented; speech intact; face symmetrical; moves all extremities well; CNII-XII intact without focal deficit   Assessment:  1. Left elbow pain     Plan:  Will update X-ray; physical exam is reassuring; ? If she may have bruised the bone; follow up to be determined based on X-ray results; encouraged applying ice and she opts to use Ibuprofen which she has at home.   No follow-ups on file.  Orders Placed This Encounter  Procedures   DG Elbow 2 Views Left    Standing Status:   Future    Number of Occurrences:   1    Standing Expiration Date:   10/22/2022    Order Specific Question:   Reason for Exam (SYMPTOM  OR DIAGNOSIS REQUIRED)    Answer:   left elbow pain    Order Specific  Question:   Is the patient pregnant?    Answer:   No    Order Specific Question:   Preferred imaging location?    Answer:   [facility]    Requested Prescriptions    No prescriptions requested or ordered in this encounter

## 2022-06-01 ENCOUNTER — Ambulatory Visit: Payer: PRIVATE HEALTH INSURANCE | Admitting: Obstetrics & Gynecology

## 2022-06-01 ENCOUNTER — Encounter: Payer: Self-pay | Admitting: Obstetrics & Gynecology

## 2022-06-01 VITALS — BP 117/86 | HR 64 | Ht 64.0 in | Wt 109.0 lb

## 2022-06-01 DIAGNOSIS — G4709 Other insomnia: Secondary | ICD-10-CM

## 2022-06-01 DIAGNOSIS — R61 Generalized hyperhidrosis: Secondary | ICD-10-CM | POA: Diagnosis not present

## 2022-06-01 DIAGNOSIS — N941 Unspecified dyspareunia: Secondary | ICD-10-CM | POA: Diagnosis not present

## 2022-06-01 MED ORDER — OSPHENA 60 MG PO TABS: 60.0000 mg | ORAL_TABLET | Freq: Every day | ORAL | 6 refills | Status: DC

## 2022-06-01 NOTE — Progress Notes (Addendum)
  GYN VISIT Patient name: Meghan Henderson MRN [id number]  Date of birth: 1975-05-11 Chief Complaint:   Perimenopause  History of Present Illness:   Meghan Henderson is a 47 y.o. [phone] perimenopausal female being seen today for the following concerns:  Insomnia- sleep pattern is out of whack.  She is not sure if it is night sweats or just difficulty sleeping.  Denies hot flashes or symptoms during the day.  Also, notes changes in her skin.  Notes considerable vaginal dryness. Decreased libido.  +Dyspareunia.  Not using lubricant.    -Paragard removed over the summer in [place] -Menses are still every month- typically 4- 5 days and mostly "normal"  Reports no other acute complaints.  Denies vaginal discharge itching or irritation.  Denies pelvic or abdominal pain  Patient's last menstrual period was 05/22/2022.     04/21/2022    3:28 PM 06/17/2021   10:04 AM  Depression screen PHQ 2/9  Decreased Interest 0 0  Down, Depressed, Hopeless 0 0  PHQ - 2 Score 0 0     Review of Systems:   Pertinent items are noted in HPI Denies fever/chills, dizziness, headaches, visual disturbances, fatigue, shortness of breath, chest pain, abdominal pain, vomiting, no problems with periods, bowel movements, urination, or intercourse unless otherwise stated above.  Pertinent History Reviewed:  Reviewed past medical,surgical, social, obstetrical and family history.  Reviewed problem list, medications and allergies. Physical Assessment:   Vitals:   06/01/22 1106  BP: 117/86  Pulse: 64  Weight: 109 lb (49.4 kg)  Height: 5\' 4"  (1.626 m)  Body mass index is 18.71 kg/m.       Physical Examination:   General appearance: alert, well appearing, and in no distress  Psych: mood appropriate, normal affect  Skin: warm & dry   Cardiovascular: normal heart rate noted  Respiratory: normal respiratory effort, no distress  Pelvic: examination not indicated  Chaperone: N/A    Assessment & Plan:  1) insomnia,  night sweats -Reviewed conservative management options and encourage patient to start magnesium glycinate -Also encouraged patient to do some research regarding alternative options to treatment and help improve night sweats (ex. cooling necklace) -Current symptoms: 7/10 set goal of 2-3/10 []  If still symptomatic may consider HRT in the future  2) Dyspareunia -Encourage patient to start using lubricants and reviewed options -Discussed local estrogen therapy. Reviewed different options such as Estrace Vagifem and/or Osphena -Patient desires to try Osphena -Follow-up in 3 months  Meds ordered this encounter  Medications   Ospemifene (OSPHENA) 60 MG TABS    Sig: Take 1 tablet (60 mg total) by mouth daily.    Dispense:  30 tablet    Refill:  6      Return in about 3 months (around 08/31/2022) for Medication follow up- MD preferred.   [name], DO Attending Obstetrician & Gynecologist, [facility] for [employer], [facility]

## 2022-06-05 ENCOUNTER — Encounter: Payer: Self-pay | Admitting: *Deleted

## 2022-06-08 ENCOUNTER — Other Ambulatory Visit: Payer: Self-pay | Admitting: *Deleted

## 2022-06-08 DIAGNOSIS — N941 Unspecified dyspareunia: Secondary | ICD-10-CM

## 2022-06-08 MED ORDER — OSPHENA 60 MG PO TABS: 60.0000 mg | ORAL_TABLET | Freq: Every day | ORAL | 0 refills | Status: AC

## 2022-06-19 ENCOUNTER — Encounter: Payer: PRIVATE HEALTH INSURANCE | Admitting: Medical

## 2022-07-03 ENCOUNTER — Encounter: Payer: PRIVATE HEALTH INSURANCE | Admitting: Medical

## 2022-07-19 ENCOUNTER — Encounter: Payer: Self-pay | Admitting: Medical

## 2022-07-19 ENCOUNTER — Ambulatory Visit (INDEPENDENT_AMBULATORY_CARE_PROVIDER_SITE_OTHER): Payer: PRIVATE HEALTH INSURANCE | Admitting: Medical

## 2022-07-19 VITALS — BP 132/78 | HR 68 | Temp 98.0°F | Resp 18 | Ht 64.0 in | Wt 111.6 lb

## 2022-07-19 DIAGNOSIS — Z0000 Encounter for general adult medical examination without abnormal findings: Secondary | ICD-10-CM

## 2022-07-19 DIAGNOSIS — R5383 Other fatigue: Secondary | ICD-10-CM

## 2022-07-19 DIAGNOSIS — R232 Flushing: Secondary | ICD-10-CM | POA: Diagnosis not present

## 2022-07-19 NOTE — Patient Instructions (Addendum)
For you wellness exam today I have ordered cbc, cmp and lipid panel.   Vaccine appear up to date.  Recommend exercise and healthy diet.  We will let you know lab results as they come in.  Follow up date appointment will be determined after lab review.    For fatigue added labs. B12, b1, iron, tsh and t4.  For hot flashes occasional got fsh.  Preventive Care 49-49 Years Old, Female Preventive care refers to lifestyle choices and visits with your health care provider that can promote health and wellness. Preventive care visits are also called wellness exams. What can I expect for my preventive care visit? Counseling Your health care provider may ask you questions about your: Medical history, including: Past medical problems. Family medical history. Pregnancy history. Current health, including: Menstrual cycle. Method of birth control. Emotional well-being. Home life and relationship well-being. Sexual activity and sexual health. Lifestyle, including: Alcohol, nicotine or tobacco, and drug use. Access to firearms. Diet, exercise, and sleep habits. Work and work Astronomer. Sunscreen use. Safety issues such as seatbelt and bike helmet use. Physical exam Your health care provider will check your: Height and weight. These may be used to calculate your BMI (body mass index). BMI is a measurement that tells if you are at a healthy weight. Waist circumference. This measures the distance around your waistline. This measurement also tells if you are at a healthy weight and may help predict your risk of certain diseases, such as type 2 diabetes and high blood pressure. Heart rate and blood pressure. Body temperature. Skin for abnormal spots. What immunizations do I need?  Vaccines are usually given at various ages, according to a schedule. Your health care provider will recommend vaccines for you based on your age, medical history, and lifestyle or other factors, such as travel or  where you work. What tests do I need? Screening Your health care provider may recommend screening tests for certain conditions. This may include: Lipid and cholesterol levels. Diabetes screening. This is done by checking your blood sugar (glucose) after you have not eaten for a while (fasting). Pelvic exam and Pap test. Hepatitis B test. Hepatitis C test. HIV (human immunodeficiency virus) test. STI (sexually transmitted infection) testing, if you are at risk. Lung cancer screening. Colorectal cancer screening. Mammogram. Talk with your health care provider about when you should start having regular mammograms. This may depend on whether you have a family history of breast cancer. BRCA-related cancer screening. This may be done if you have a family history of breast, ovarian, tubal, or peritoneal cancers. Bone density scan. This is done to screen for osteoporosis. Talk with your health care provider about your test results, treatment options, and if necessary, the need for more tests. Follow these instructions at home: Eating and drinking  Eat a diet that includes fresh fruits and vegetables, whole grains, lean protein, and low-fat dairy products. Take vitamin and mineral supplements as recommended by your health care provider. Do not drink alcohol if: Your health care provider tells you not to drink. You are pregnant, may be pregnant, or are planning to become pregnant. If you drink alcohol: Limit how much you have to 0-1 drink a day. Know how much alcohol is in your drink. In the [place] one drink equals one 12 oz bottle of beer (355 mL), one 5 oz glass of wine (148 mL), or one 1 oz glass of hard liquor (44 mL). Lifestyle Brush your teeth every morning and night with fluoride toothpaste.  Floss one time each day. Exercise for at least 30 minutes 5 or more days each week. Do not use any products that contain nicotine or tobacco. These products include cigarettes, chewing tobacco, and  vaping devices, such as e-cigarettes. If you need help quitting, ask your health care provider. Do not use drugs. If you are sexually active, practice safe sex. Use a condom or other form of protection to prevent STIs. If you do not wish to become pregnant, use a form of birth control. If you plan to become pregnant, see your health care provider for a prepregnancy visit. Take aspirin only as told by your health care provider. Make sure that you understand how much to take and what form to take. Work with your health care provider to find out whether it is safe and beneficial for you to take aspirin daily. Find healthy ways to manage stress, such as: Meditation, yoga, or listening to music. Journaling. Talking to a trusted person. Spending time with friends and family. Minimize exposure to UV radiation to reduce your risk of skin cancer. Safety Always wear your seat belt while driving or riding in a vehicle. Do not drive: If you have been drinking alcohol. Do not ride with someone who has been drinking. When you are tired or distracted. While texting. If you have been using any mind-altering substances or drugs. Wear a helmet and other protective equipment during sports activities. If you have firearms in your house, make sure you follow all gun safety procedures. Seek help if you have been physically or sexually abused. What's next? Visit your health care provider once a year for an annual wellness visit. Ask your health care provider how often you should have your eyes and teeth checked. Stay up to date on all vaccines. This information is not intended to replace advice given to you by your health care provider. Make sure you discuss any questions you have with your health care provider. Document Revised: 08/04/2020 Document Reviewed: 08/04/2020 Elsevier Patient Education  2024 [employer].

## 2022-07-19 NOTE — Progress Notes (Signed)
Subjective:    Patient ID: Maryclare Bean, female    DOB: [birth year], 47 y.o.   MRN: [id number]  HPI Pt in for wellness exam. She is not  fasting.   Up to date on mammogram, pap and colonoscopy.  Up to date on vaccines.  Pt has concerns for perimenopause. She has recently had appt with gyn but per her report no labs were done.  On review she has mild fatigue.  Review of Systems  Constitutional:  Positive for fatigue.  HENT:  Negative for congestion, sinus pressure and sinus pain.   Respiratory:  Negative for cough and wheezing.   Cardiovascular:  Negative for chest pain and palpitations.  Gastrointestinal:  Negative for abdominal pain.  Endocrine:       Possible occasional hot flash  Genitourinary:  Negative for dysuria, flank pain and frequency.  Musculoskeletal:  Negative for arthralgias and back pain.  Neurological:  Negative for dizziness, syncope and light-headedness.  Hematological:  Negative for adenopathy. Does not bruise/bleed easily.  Psychiatric/Behavioral:  Negative for decreased concentration and dysphoric mood. The patient is not nervous/anxious.    Past Medical History:  Diagnosis Date   Constipation    COVID 06/2020   Hyperlipidemia    Lateral epicondylitis of right elbow 05/06/2020     Social History   Socioeconomic History   Marital status: Legally Separated    Spouse name: Not on file   Number of children: Not on file   Years of education: Not on file   Highest education level: Not on file  Occupational History   Occupation: Systems developer.  Tobacco Use   Smoking status: Never   Smokeless tobacco: Never  Vaping Use   Vaping Use: Never used  Substance and Sexual Activity   Alcohol use: Yes    Alcohol/week: 1.0 standard drink of alcohol    Types: 1 Glasses of wine per week   Drug use: Never   Sexual activity: Yes    Birth control/protection: I.U.D.  Other Topics Concern   Not on file  Social History Narrative   Not on file   Social  Determinants of Health   Financial Resource Strain: Not on file  Food Insecurity: Not on file  Transportation Needs: Not on file  Physical Activity: Not on file  Stress: Not on file  Social Connections: Not on file  Intimate Partner Violence: Not on file    Past Surgical History:  Procedure Laterality Date   CESAREAN SECTION     COLONOSCOPY  09/03/2020    Family History  Problem Relation Age of Onset   Hypertension Mother    Hyperlipidemia Mother    Diabetes Father    Colon polyps Neg Hx    Colon cancer Neg Hx    Esophageal cancer Neg Hx    Stomach cancer Neg Hx    Rectal cancer Neg Hx     Allergies  Allergen Reactions   Sulfamethoxazole Rash    Current Outpatient Medications on File Prior to Visit  Medication Sig Dispense Refill   Ospemifene (OSPHENA PO) Take by mouth.     b complex vitamins capsule Take 1 capsule by mouth daily.     Omega-3 1000 MG CAPS Take 1 capsule by mouth daily.     PARAGARD INTRAUTERINE COPPER IU by Intrauterine route. (Patient not taking: Reported on 04/21/2022)     VITAMIN D PO Take 1 tablet by mouth daily.     No current facility-administered medications on file prior to visit.  BP 132/78   Pulse 68   Temp 98 F (36.7 C)   Resp 18   Ht 5\' 4"  (1.626 m)   Wt 111 lb 9.6 oz (50.6 kg)   LMP 06/26/2022   SpO2 100%   BMI 19.16 kg/m        Objective:   Physical Exam  General Mental Status- Alert. General Appearance- Not in acute distress.   Skin General: Color- Normal Color. Moisture- Normal Moisture.  Neck No thyromegaly.   Chest and Lung Exam Auscultation: Breath Sounds:-Normal.  Cardiovascular Auscultation:Rythm- Regular. Murmurs & Other Heart Sounds:Auscultation of the heart reveals- No Murmurs.  Abdomen Inspection:-Inspeection Normal. Palpation/Percussion:Note:No mass. Palpation and Percussion of the abdomen reveal- Non Tender, Non Distended + BS, no rebound or guarding.   Neurologic Cranial Nerve exam:- CN  III-XII intact(No nystagmus), symmetric smile. Strength:- 5/5 equal and symmetric strength both upper and lower extremities.       Assessment & Plan:  For you wellness exam today I have ordered cbc, cmp and lipid panel.   Vaccine appear up to date.  Recommend exercise and healthy diet.  We will let you know lab results as they come in.  Follow up date appointment will be determined after lab review.    For fatigue added labs. B12, b1, iron, tsh and t4.  For hot flashes occasional got fsh.  Esperanza Richters, PA-C

## 2022-07-21 ENCOUNTER — Other Ambulatory Visit (INDEPENDENT_AMBULATORY_CARE_PROVIDER_SITE_OTHER): Payer: PRIVATE HEALTH INSURANCE

## 2022-07-21 DIAGNOSIS — Z0000 Encounter for general adult medical examination without abnormal findings: Secondary | ICD-10-CM | POA: Diagnosis not present

## 2022-07-21 DIAGNOSIS — R232 Flushing: Secondary | ICD-10-CM | POA: Diagnosis not present

## 2022-07-21 DIAGNOSIS — R5383 Other fatigue: Secondary | ICD-10-CM

## 2022-07-21 LAB — COMPREHENSIVE METABOLIC PANEL
ALT: 13 U/L (ref 0–35)
AST: 14 U/L (ref 0–37)
Albumin: 4.2 g/dL (ref 3.5–5.2)
Alkaline Phosphatase: 56 U/L (ref 39–117)
BUN: 8 mg/dL (ref 6–23)
CO2: 29 mEq/L (ref 19–32)
Calcium: 8.9 mg/dL (ref 8.4–10.5)
Chloride: 104 mEq/L (ref 96–112)
Creatinine, Ser: 0.8 mg/dL (ref 0.40–1.20)
GFR: 86.88 mL/min (ref >60.00)
Glucose, Bld: 81 mg/dL (ref 70–99)
Potassium: 4.3 mEq/L (ref 3.5–5.1)
Sodium: 139 mEq/L (ref 135–145)
Total Bilirubin: 0.7 mg/dL (ref 0.2–1.2)
Total Protein: 6.8 g/dL (ref 6.0–8.3)

## 2022-07-21 LAB — CBC WITH DIFFERENTIAL/PLATELET
Basophils Absolute: 0 10*3/uL (ref 0.0–0.1)
Basophils Relative: 0.7 % (ref 0.0–3.0)
Eosinophils Absolute: 0.1 10*3/uL (ref 0.0–0.7)
Eosinophils Relative: 1.3 % (ref 0.0–5.0)
HCT: 44 % (ref 36.0–46.0)
Hemoglobin: 14.3 g/dL (ref 12.0–15.0)
Lymphocytes Relative: 31.1 % (ref 12.0–46.0)
Lymphs Abs: 2.1 10*3/uL (ref 0.7–4.0)
MCHC: 32.4 g/dL (ref 30.0–36.0)
MCV: 90.5 fl (ref 78.0–100.0)
Monocytes Absolute: 0.5 10*3/uL (ref 0.1–1.0)
Monocytes Relative: 6.9 % (ref 3.0–12.0)
Neutro Abs: 4.1 10*3/uL (ref 1.4–7.7)
Neutrophils Relative %: 60 % (ref 43.0–77.0)
Platelets: 265 10*3/uL (ref 150.0–400.0)
RBC: 4.86 Mil/uL (ref 3.87–5.11)
RDW: 13.5 % (ref 11.5–15.5)
WBC: 6.9 10*3/uL (ref 4.0–10.5)

## 2022-07-21 LAB — IRON: Iron: 83 ug/dL (ref 42–145)

## 2022-07-21 LAB — VITAMIN B12: Vitamin B-12: 289 pg/mL (ref 211–911)

## 2022-07-21 LAB — LIPID PANEL
Cholesterol: 245 mg/dL — ABNORMAL HIGH (ref 0–200)
HDL: 61.8 mg/dL (ref >39.00)
LDL Cholesterol: 157 mg/dL — ABNORMAL HIGH (ref 0–99)
NonHDL: 182.71
Total CHOL/HDL Ratio: 4
Triglycerides: 130 mg/dL (ref 0.0–149.0)
VLDL: 26 mg/dL (ref 0.0–40.0)

## 2022-07-21 LAB — FOLLICLE STIMULATING HORMONE: FSH: 27.4 m[IU]/mL

## 2022-07-21 LAB — T4, FREE: Free T4: 0.97 ng/dL (ref 0.60–1.60)

## 2022-07-21 LAB — TSH: TSH: 1.79 u[IU]/mL (ref 0.35–5.50)

## 2022-07-24 LAB — VITAMIN B1: Vitamin B1 (Thiamine): 12 nmol/L (ref 8–30)

## 2022-09-05 ENCOUNTER — Other Ambulatory Visit: Payer: Self-pay | Admitting: Obstetrics & Gynecology

## 2022-09-05 DIAGNOSIS — N941 Unspecified dyspareunia: Secondary | ICD-10-CM

## 2022-09-06 ENCOUNTER — Encounter: Payer: Self-pay | Admitting: Obstetrics & Gynecology

## 2022-09-07 ENCOUNTER — Other Ambulatory Visit: Payer: Self-pay | Admitting: Obstetrics & Gynecology

## 2022-09-07 DIAGNOSIS — R61 Generalized hyperhidrosis: Secondary | ICD-10-CM

## 2022-09-07 MED ORDER — OSPHENA 60 MG PO TABS
1.0000 | ORAL_TABLET | Freq: Every day | ORAL | 4 refills | Status: AC
Start: 2022-09-07 — End: 2022-12-06

## 2022-09-07 NOTE — Progress Notes (Signed)
Prescription for osphena sent in  [name], DO Attending Obstetrician & Gynecologist, [facility] for [facility], [facility]

## 2022-09-12 ENCOUNTER — Other Ambulatory Visit: Payer: Self-pay | Admitting: *Deleted

## 2022-09-12 DIAGNOSIS — R61 Generalized hyperhidrosis: Secondary | ICD-10-CM

## 2022-09-12 MED ORDER — OSPHENA 60 MG PO TABS
1.0000 | ORAL_TABLET | Freq: Every day | ORAL | 3 refills | Status: AC
Start: 2022-09-12 — End: 2023-09-07

## 2022-12-01 ENCOUNTER — Encounter: Payer: Self-pay | Admitting: Medical

## 2022-12-04 ENCOUNTER — Ambulatory Visit: Payer: PRIVATE HEALTH INSURANCE | Admitting: Medical

## 2022-12-05 ENCOUNTER — Ambulatory Visit (INDEPENDENT_AMBULATORY_CARE_PROVIDER_SITE_OTHER): Payer: PRIVATE HEALTH INSURANCE | Admitting: Medical

## 2022-12-05 ENCOUNTER — Encounter: Payer: Self-pay | Admitting: Medical

## 2022-12-05 VITALS — BP 112/75 | HR 76 | Resp 18 | Ht 64.0 in | Wt 110.8 lb

## 2022-12-05 DIAGNOSIS — G4452 New daily persistent headache (NDPH): Secondary | ICD-10-CM

## 2022-12-05 DIAGNOSIS — H538 Other visual disturbances: Secondary | ICD-10-CM | POA: Diagnosis not present

## 2022-12-05 DIAGNOSIS — R519 Headache, unspecified: Secondary | ICD-10-CM

## 2022-12-05 DIAGNOSIS — G4489 Other headache syndrome: Secondary | ICD-10-CM

## 2022-12-05 DIAGNOSIS — Z23 Encounter for immunization: Secondary | ICD-10-CM

## 2022-12-05 MED ORDER — CYCLOBENZAPRINE HCL 5 MG PO TABS: ORAL_TABLET | ORAL | 0 refills | Status: AC

## 2022-12-05 NOTE — Patient Instructions (Addendum)
New onset ha. worse duration and intensity than prior random self limited ha. Recent increase in severity and duration of headaches associated with menstrual cycle. No associated photophobia, phonophobia, nausea, or vomiting. Mild blurred vision during ha.  Also  neck pain reported. Possible exacerbation due to recent stressors. -Consider Flexeril 5mg  for muscle relaxation if headache recurs with neck muscle soreness. Cautioned about potential drowsiness and advised not to drive if taking Flexeril. -Consider low-dose ibuprofen 200mg  with tylenol  in conjunction with Flexeril for headache relief. -After discussion ordered  CT scan, pending insurance approval, due to new onset of severe headache. Pt expressed concern due to severe change in severity and duration along with blurred vision  Menstrual irregularity Recent irregularity in menstrual cycle since June, with skipped months. Possible early menopausal symptoms reported, including feeling hot at night. -Continue to monitor symptoms and menstrual cycle regularity.  Stress Upcoming move and relationship separation contributing to increased stress levels. -Encouraged to manage stress levels and consider seeking support if needed.   follow up in 10 days or sooner if needed

## 2022-12-05 NOTE — Progress Notes (Unsigned)
Subjective:    Patient ID: Meghan Henderson, female    DOB: June 19, 1975, 47 y.o.   MRN: [id number]  HPI  Discussed the use of AI scribe software for clinical note transcription with the patient, who gave verbal consent to proceed.  History of Present Illness   The patient, with a history of menstrual-associated headaches, presents with a recent episode of headache lasting four-five days, which is longer and more severe than her typical one day headaches. This headache was associated with her menstrual cycle, which has been irregular since June, with skipped periods in July and October. The patient reports waking up with the headache, which persisted throughout the day. She attempted to manage the pain with Tylenol, including a stronger dose of 650mg , but found no relief. some blurred vision during the ha.  The patient denies light sensitivity, sound sensitivity, nausea, and vomiting during the headache episode. However, she reports a slight blurring of vision.    The patient also experienced bilateral neck pain during the headache, which she attributes to stress related to an upcoming move and a recent separation.   No hx of migraine ha. no light sensitivity, sound sensitivity, nausea or vomiting.  The patient has been experiencing potential menopausal symptoms, including feeling hot at night, but denies any sweating. She has not had any migraine headaches in the past. The patient expresses concern about the severity and duration of this recent headache, which deviates from her typical pattern.        Review of Systems  Constitutional:  Negative for chills and fatigue.  Respiratory:  Negative for cough, chest tightness and wheezing.   Cardiovascular:  Negative for chest pain and palpitations.  Gastrointestinal:  Negative for abdominal pain.  Musculoskeletal:  Negative for back pain and joint swelling.  Neurological:  Negative for dizziness, speech difficulty, weakness and headaches.        See hpi on feature and duration of ha.  Hematological:  Negative for adenopathy. Does not bruise/bleed easily.  Psychiatric/Behavioral:  Negative for behavioral problems and decreased concentration.    Past Medical History:  Diagnosis Date   Constipation    COVID 06/2020   Hyperlipidemia    Lateral epicondylitis of right elbow 05/06/2020     Social History   Socioeconomic History   Marital status: Legally Separated    Spouse name: Not on file   Number of children: Not on file   Years of education: Not on file   Highest education level: Not on file  Occupational History   Occupation: Systems developer.  Tobacco Use   Smoking status: Never   Smokeless tobacco: Never  Vaping Use   Vaping status: Never Used  Substance and Sexual Activity   Alcohol use: Yes    Alcohol/week: 1.0 standard drink of alcohol    Types: 1 Glasses of wine per week   Drug use: Never   Sexual activity: Yes    Birth control/protection: I.U.D.  Other Topics Concern   Not on file  Social History Narrative   Not on file   Social Determinants of Health   Financial Resource Strain: Not on file  Food Insecurity: Not on file  Transportation Needs: Not on file  Physical Activity: Not on file  Stress: Not on file  Social Connections: Not on file  Intimate Partner Violence: Not on file    Past Surgical History:  Procedure Laterality Date   CESAREAN SECTION     COLONOSCOPY  09/03/2020    Family History  Problem Relation  Age of Onset   Hypertension Mother    Hyperlipidemia Mother    Diabetes Father    Colon polyps Neg Hx    Colon cancer Neg Hx    Esophageal cancer Neg Hx    Stomach cancer Neg Hx    Rectal cancer Neg Hx     Allergies  Allergen Reactions   Sulfamethoxazole Rash    Current Outpatient Medications on File Prior to Visit  Medication Sig Dispense Refill   b complex vitamins capsule Take 1 capsule by mouth daily.     Omega-3 1000 MG CAPS Take 1 capsule by mouth daily.     Ospemifene  (OSPHENA) 60 MG TABS Take 1 tablet (60 mg total) by mouth daily. 90 tablet 3   PARAGARD INTRAUTERINE COPPER IU by Intrauterine route. (Patient not taking: Reported on 04/21/2022)     VITAMIN D PO Take 1 tablet by mouth daily.     No current facility-administered medications on file prior to visit.    BP 112/75   Pulse 76   Resp 18   Ht 5\' 4"  (1.626 m)   Wt 110 lb 12.8 oz (50.3 kg)   LMP 11/24/2022   SpO2 100%   BMI 19.02 kg/m        Objective:   Physical Exam General Mental Status- Alert. General Appearance- Not in acute distress.   Skin General: Color- Normal Color. Moisture- Normal Moisture.  Neck Mild trapezius tightness back of neck near occipital area.  Chest and Lung Exam Auscultation: Breath Sounds:-Normal.  Cardiovascular Auscultation:Rythm- Regular. Murmurs & Other Heart Sounds:Auscultation of the heart reveals- No Murmurs.  Abdomen Inspection:-Inspeection Normal. Palpation/Percussion:Note:No mass. Palpation and Percussion of the abdomen reveal- Non Tender, Non Distended + BS, no rebound or guarding.   Neurologic Cranial Nerve exam:- CN III-XII intact(No nystagmus), symmetric smile. Drift Test:- No drift. Romberg Exam:- Negative.  Finger to Nose:- Normal/Intact Strength:- 5/5 equal and symmetric strength both upper and lower extremities.        Assessment & Plan:   Assessment and Plan    new onset ha. worse duration and intensity than prior random self limited ha. Recent increase in severity and duration of headaches associated with menstrual cycle. No associated photophobia, phonophobia, nausea, or vomiting. Mild blurred vision during ha.  Also  neck pain reported. Possible exacerbation due to recent stressors. -Consider Flexeril 5mg  for muscle relaxation if headache recurs with neck muscle soreness. Cautioned about potential drowsiness and advised not to drive if taking Flexeril. -Consider low-dose ibuprofen 200mg  in conjunction with Flexeril for  headache relief. -After discussion ordered  CT scan, pending insurance approval, due to new onset of severe headache. Pt expressed concern due to severe change in severity and duration along with blurred vision  Menstrual irregularity Recent irregularity in menstrual cycle since June, with skipped months. Possible early menopausal symptoms reported, including feeling hot at night. -Continue to monitor symptoms and menstrual cycle regularity.  Stress Upcoming move and relationship separation contributing to increased stress levels. -Encouraged to manage stress levels and consider seeking support if needed.   follow up in 10 days or sooner if needed        Time spent with patient today was  42 minutes which consisted of chart review, discussing diagnosis, work up, treatment and documentation.

## 2022-12-06 ENCOUNTER — Ambulatory Visit (HOSPITAL_BASED_OUTPATIENT_CLINIC_OR_DEPARTMENT_OTHER)
Admission: RE | Admit: 2022-12-06 | Discharge: 2022-12-06 | Disposition: A | Discharge status: Home / Self Care | Payer: PRIVATE HEALTH INSURANCE | Source: Ambulatory Visit | Attending: Medical | Admitting: Medical

## 2022-12-06 DIAGNOSIS — G4452 New daily persistent headache (NDPH): Secondary | ICD-10-CM | POA: Insufficient documentation

## 2022-12-06 DIAGNOSIS — H538 Other visual disturbances: Secondary | ICD-10-CM | POA: Diagnosis present

## 2023-03-10 IMAGING — US US PELVIS COMPLETE WITH TRANSVAGINAL
1 series · 13 of 25 positions shown · non-contrast
Comparison: None Available.

CLINICAL DATA: Vaginal spotting

EXAM:
TRANSABDOMINAL AND TRANSVAGINAL ULTRASOUND OF PELVIS
TECHNIQUE: Both transabdominal and transvaginal ultrasound examinations of the
pelvis were performed. Transabdominal technique was performed for
global imaging of the pelvis including uterus, ovaries, adnexal
regions, and pelvic cul-de-sac. It was necessary to proceed with
endovaginal exam following the transabdominal exam to visualize the
uterus endometrium ovaries.

[Series 1: us pelvic complete with transvaginal · 118 acquisitions, 13 frames shown]
[im 1/118]
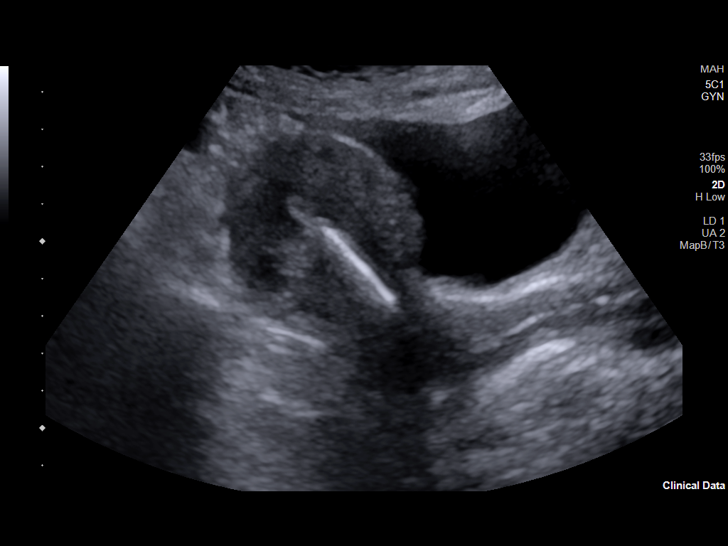
[im 10/118]
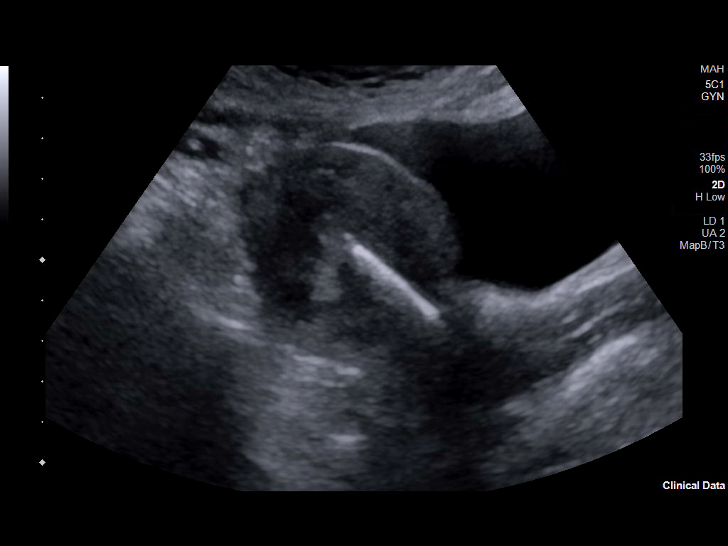
[im 20/118]
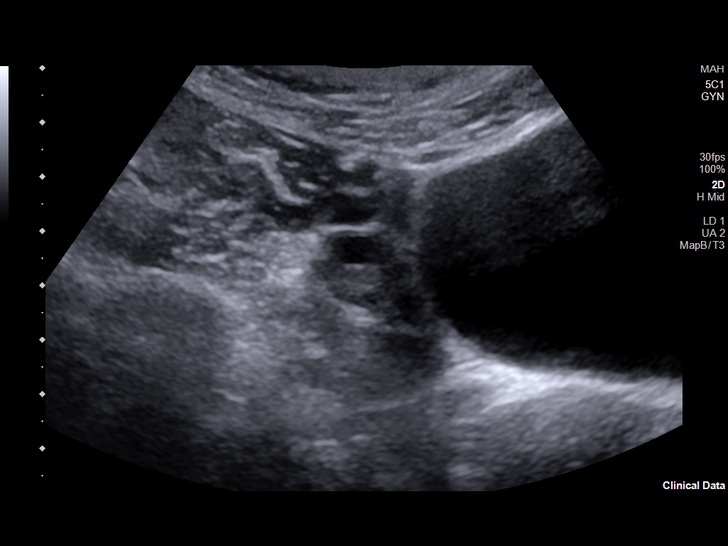
[im 30/118]
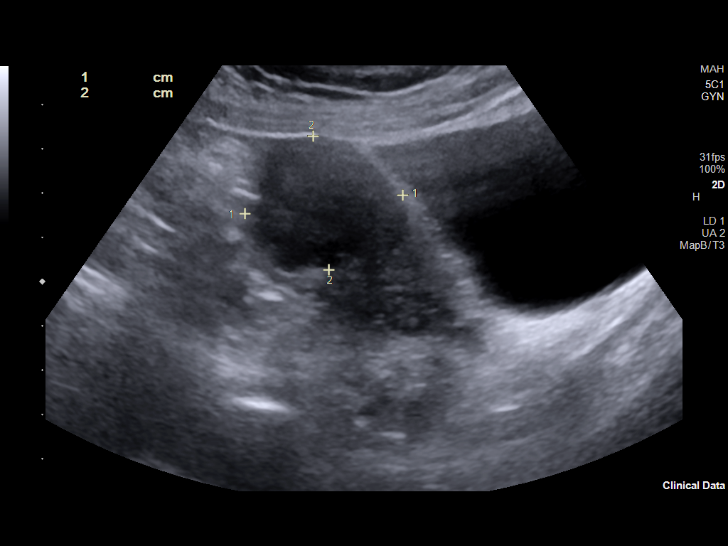
[im 40/118]
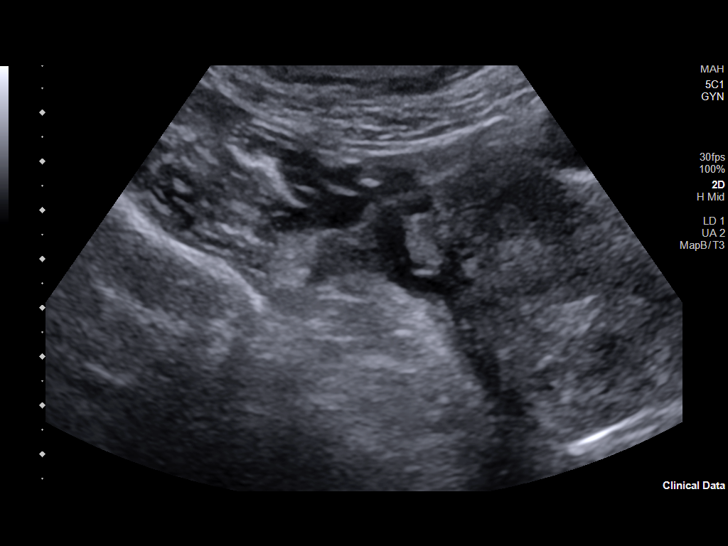
[im 49/118]
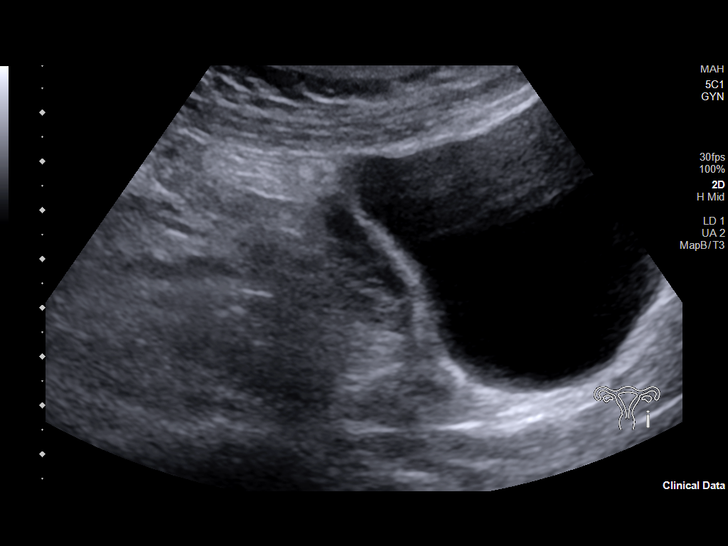
[im 59/118]
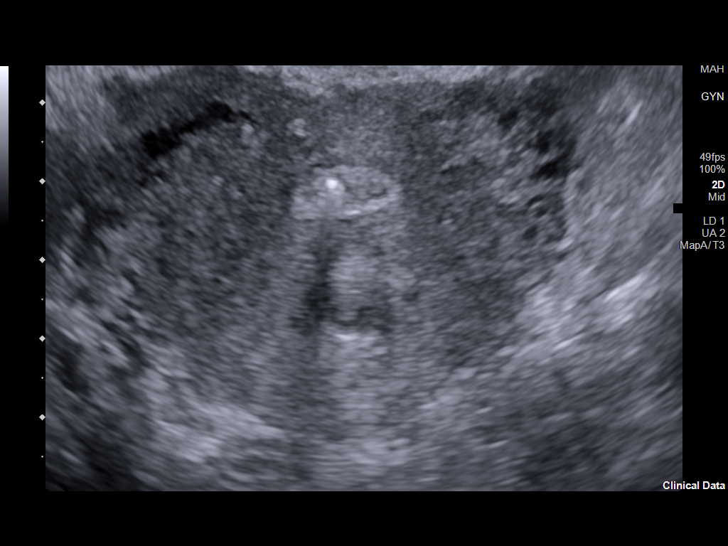
[im 69/118]
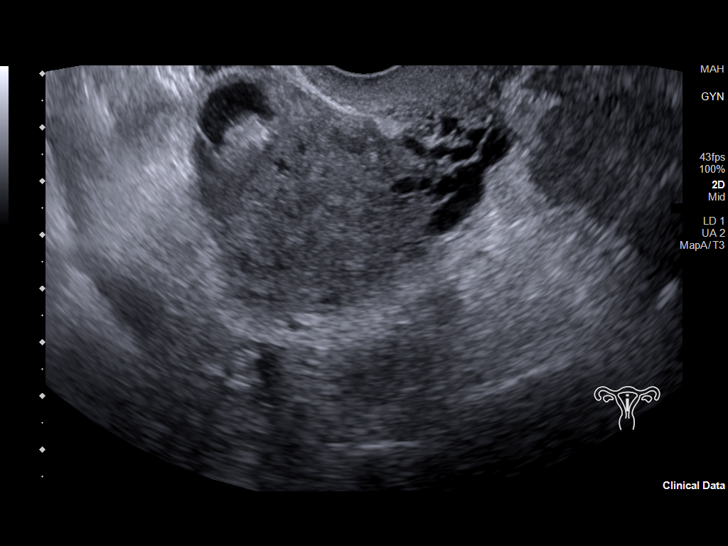
[im 79/118]
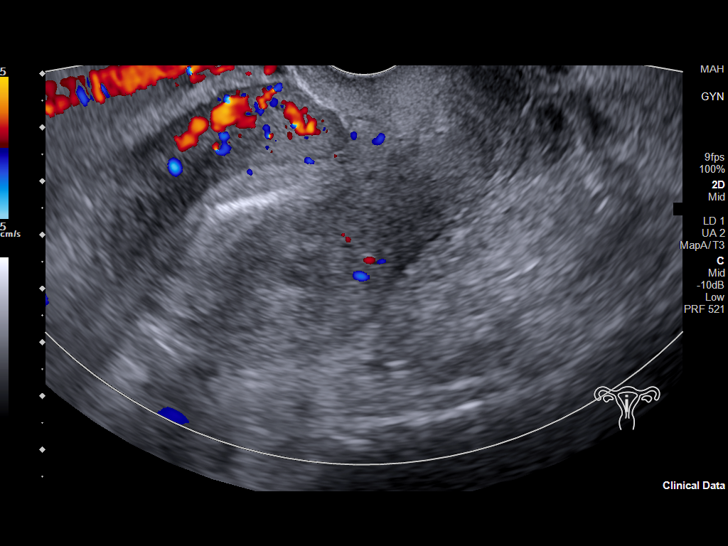
[im 88/118]
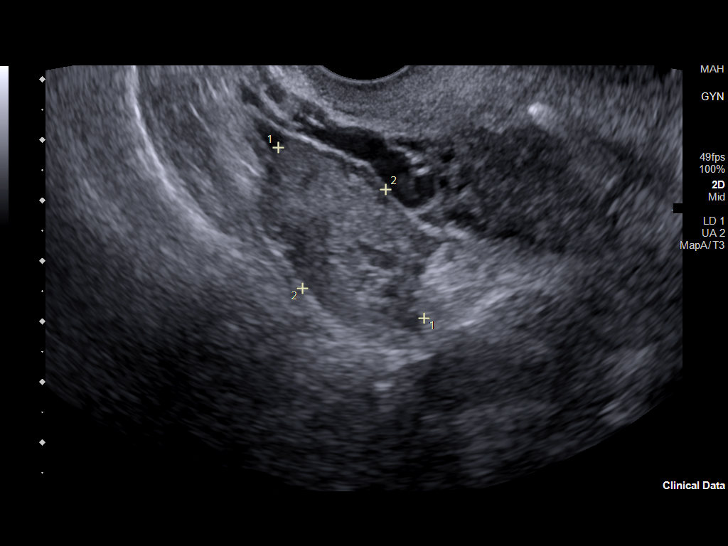
[im 98/118]
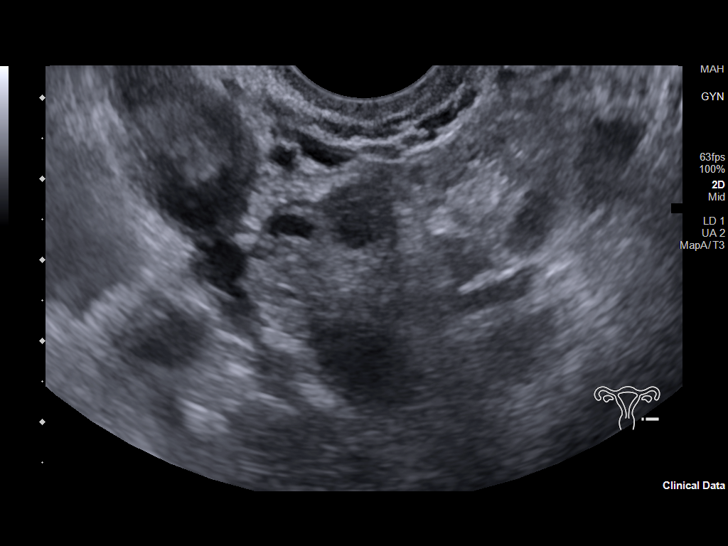
[im 108/118]
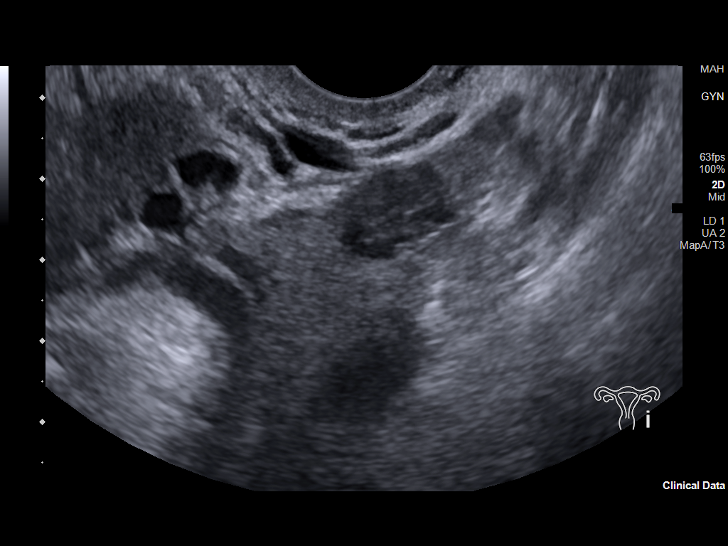
[im 118/118]
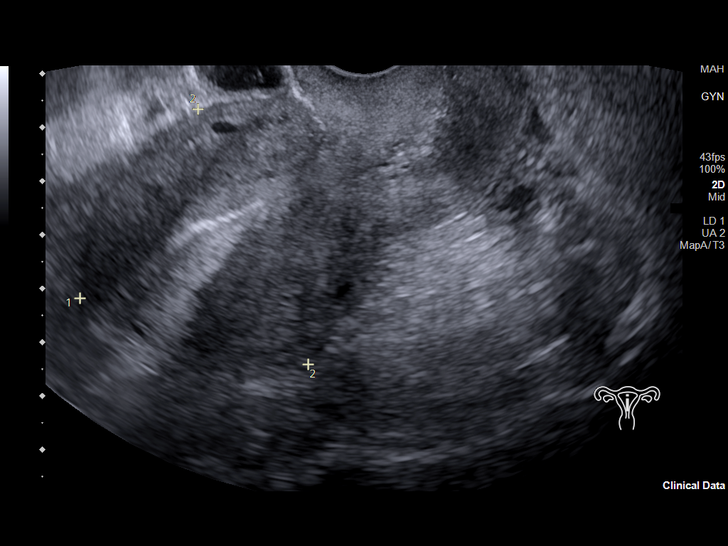

[13 of 25 positions shown; findings below may reference images not displayed]

FINDINGS: Uterus

Measurements: 8.5 x 4.8 x 5.7 cm = volume: 122.6 mL. Exophytic right
fundal mass measuring 3.6 x 3 x 3.6 cm consistent with fibroid.
Smaller posterior uterine corpus intramural mass measuring 10 x 9 x
12 mm.

Endometrium

Thickness: 13.3 mm.  IUD visible within the uterine body and fundus

Right ovary

Measurements: 3.7 x 2.1 x 2.1 cm = volume: 8.8 mL. Normal
appearance/no adnexal mass.

Left ovary

Measurements: 4 x 1.9 x 1.8 cm = volume: 7.3 mL. Probable corpus
luteum in the left ovary measuring 15 mm, no specific follow-up
recommended.

Other findings

No abnormal free fluid.
IMPRESSION: 1. Uterine fibroids.
2. Normal premenopausal endometrial thickness. IUD appears grossly
appropriate in position by sonography
